# Patient Record
Sex: Male | Born: 1950
Health system: Southern US, Community
[De-identification: ages and names within clinical notes are randomized; demographics above are authoritative.]

## PROBLEM LIST (undated history)

## (undated) DIAGNOSIS — IMO0002 Reserved for concepts with insufficient information to code with codable children: Secondary | ICD-10-CM

## (undated) DIAGNOSIS — I2510 Atherosclerotic heart disease of native coronary artery without angina pectoris: Secondary | ICD-10-CM

## (undated) DIAGNOSIS — G4730 Sleep apnea, unspecified: Secondary | ICD-10-CM

## (undated) DIAGNOSIS — D3A020 Benign carcinoid tumor of the appendix: Secondary | ICD-10-CM

## (undated) DIAGNOSIS — I219 Acute myocardial infarction, unspecified: Secondary | ICD-10-CM

## (undated) DIAGNOSIS — E785 Hyperlipidemia, unspecified: Secondary | ICD-10-CM

## (undated) DIAGNOSIS — R0789 Other chest pain: Secondary | ICD-10-CM

## (undated) DIAGNOSIS — K2270 Barrett's esophagus without dysplasia: Secondary | ICD-10-CM

## (undated) HISTORY — DX: Benign carcinoid tumor of the appendix: D3A.020

## (undated) HISTORY — DX: Hyperlipidemia, unspecified: E78.5

## (undated) HISTORY — DX: Other chest pain: R07.89

## (undated) HISTORY — PX: CARDIAC CATHETERIZATION: SHX172

## (undated) HISTORY — PX: COLONOSCOPY: SHX174

## (undated) HISTORY — PX: UPPER GASTROINTESTINAL ENDOSCOPY: SHX188

## (undated) HISTORY — DX: Barrett's esophagus without dysplasia: K22.70

## (undated) HISTORY — PX: TONSILLECTOMY: SUR1361

## (undated) HISTORY — PX: HERNIA REPAIR: SHX51

---

## 1994-01-15 HISTORY — PX: OTHER SURGICAL HISTORY: SHX169

## 1994-01-15 HISTORY — PX: SPLENECTOMY: SUR1306

## 1994-01-15 HISTORY — PX: APPENDECTOMY: SHX54

## 1996-01-16 HISTORY — PX: CHOLECYSTECTOMY: SHX55

## 2000-07-15 ENCOUNTER — Ambulatory Visit (HOSPITAL_COMMUNITY): Admission: RE | Admit: 2000-07-15 | Discharge: 2000-07-15 | Payer: Self-pay | Admitting: Internal Medicine

## 2001-07-01 ENCOUNTER — Ambulatory Visit (HOSPITAL_COMMUNITY): Admission: RE | Admit: 2001-07-01 | Discharge: 2001-07-01 | Payer: Self-pay | Admitting: Internal Medicine

## 2001-07-01 ENCOUNTER — Encounter: Payer: Self-pay | Admitting: Internal Medicine

## 2001-07-07 ENCOUNTER — Encounter: Payer: Self-pay | Admitting: Internal Medicine

## 2001-07-07 ENCOUNTER — Ambulatory Visit (HOSPITAL_COMMUNITY): Admission: RE | Admit: 2001-07-07 | Discharge: 2001-07-07 | Payer: Self-pay | Admitting: Internal Medicine

## 2001-09-14 ENCOUNTER — Inpatient Hospital Stay (HOSPITAL_COMMUNITY): Admission: EM | Admit: 2001-09-14 | Discharge: 2001-09-17 | Payer: Self-pay | Admitting: Emergency Medicine

## 2001-09-14 ENCOUNTER — Encounter: Payer: Self-pay | Admitting: Emergency Medicine

## 2001-09-16 ENCOUNTER — Encounter: Payer: Self-pay | Admitting: *Deleted

## 2003-04-20 ENCOUNTER — Ambulatory Visit (HOSPITAL_COMMUNITY): Admission: RE | Admit: 2003-04-20 | Discharge: 2003-04-20 | Payer: Self-pay | Admitting: Internal Medicine

## 2006-01-30 ENCOUNTER — Encounter (HOSPITAL_COMMUNITY): Admission: RE | Admit: 2006-01-30 | Discharge: 2006-03-01 | Payer: Self-pay | Admitting: Internal Medicine

## 2006-05-06 ENCOUNTER — Encounter (HOSPITAL_COMMUNITY): Admission: RE | Admit: 2006-05-06 | Discharge: 2006-06-05 | Payer: Self-pay | Admitting: Oncology

## 2006-05-06 ENCOUNTER — Ambulatory Visit (HOSPITAL_COMMUNITY): Payer: Self-pay | Admitting: Oncology

## 2006-05-07 ENCOUNTER — Encounter (HOSPITAL_COMMUNITY): Payer: Self-pay | Admitting: Oncology

## 2006-05-07 ENCOUNTER — Other Ambulatory Visit: Admission: RE | Admit: 2006-05-07 | Discharge: 2006-05-07 | Payer: Self-pay | Admitting: Oncology

## 2007-06-23 ENCOUNTER — Inpatient Hospital Stay (HOSPITAL_COMMUNITY): Admission: AD | Admit: 2007-06-23 | Discharge: 2007-06-25 | Payer: Self-pay | Admitting: Cardiology

## 2007-06-23 ENCOUNTER — Ambulatory Visit: Payer: Self-pay | Admitting: Cardiology

## 2007-06-23 ENCOUNTER — Encounter: Payer: Self-pay | Admitting: Emergency Medicine

## 2007-06-27 ENCOUNTER — Ambulatory Visit: Payer: Self-pay | Admitting: Cardiology

## 2007-06-27 ENCOUNTER — Observation Stay (HOSPITAL_COMMUNITY): Admission: AD | Admit: 2007-06-27 | Discharge: 2007-06-28 | Payer: Self-pay | Admitting: Cardiology

## 2007-06-27 ENCOUNTER — Encounter: Payer: Self-pay | Admitting: Emergency Medicine

## 2007-07-28 ENCOUNTER — Ambulatory Visit: Payer: Self-pay | Admitting: Cardiology

## 2007-11-10 ENCOUNTER — Ambulatory Visit: Payer: Self-pay | Admitting: Cardiology

## 2007-12-01 ENCOUNTER — Ambulatory Visit: Payer: Self-pay | Admitting: Cardiology

## 2007-12-04 ENCOUNTER — Encounter (HOSPITAL_COMMUNITY): Admission: RE | Admit: 2007-12-04 | Discharge: 2008-01-03 | Payer: Self-pay | Admitting: Cardiology

## 2007-12-04 ENCOUNTER — Ambulatory Visit: Payer: Self-pay | Admitting: Cardiology

## 2007-12-08 ENCOUNTER — Ambulatory Visit: Payer: Self-pay | Admitting: Cardiology

## 2008-02-11 ENCOUNTER — Ambulatory Visit: Payer: Self-pay | Admitting: Cardiology

## 2008-04-26 ENCOUNTER — Encounter (INDEPENDENT_AMBULATORY_CARE_PROVIDER_SITE_OTHER): Payer: Self-pay | Admitting: *Deleted

## 2008-07-05 DIAGNOSIS — E785 Hyperlipidemia, unspecified: Secondary | ICD-10-CM | POA: Insufficient documentation

## 2008-11-04 ENCOUNTER — Encounter (INDEPENDENT_AMBULATORY_CARE_PROVIDER_SITE_OTHER): Payer: Self-pay | Admitting: *Deleted

## 2008-11-04 ENCOUNTER — Telehealth (INDEPENDENT_AMBULATORY_CARE_PROVIDER_SITE_OTHER): Payer: Self-pay | Admitting: *Deleted

## 2009-01-18 ENCOUNTER — Encounter: Payer: Self-pay | Admitting: Cardiology

## 2009-01-20 ENCOUNTER — Encounter: Payer: Self-pay | Admitting: Cardiology

## 2009-01-20 LAB — CONVERTED CEMR LAB
AST: 22 units/L (ref 0–37)
Alkaline Phosphatase: 61 units/L (ref 39–117)
BUN: 15 mg/dL (ref 6–23)
Basophils Absolute: 0.1 10*3/uL (ref 0.0–0.1)
Basophils Relative: 1 % (ref 0–1)
Calcium: 9.1 mg/dL (ref 8.4–10.5)
Cholesterol: 130 mg/dL (ref 0–200)
Creatinine, Ser: 1.14 mg/dL (ref 0.40–1.50)
HCT: 49.2 % (ref 39.0–52.0)
HDL: 33 mg/dL — ABNORMAL LOW (ref >39)
LDL Cholesterol: 66 mg/dL (ref 0–99)
Lymphocytes Relative: 45 % (ref 12–46)
MCHC: 32.5 g/dL (ref 30.0–36.0)
MCV: 95.5 fL (ref 78.0–100.0)
Platelets: 482 10*3/uL — ABNORMAL HIGH (ref 150–400)
Total CHOL/HDL Ratio: 3.9
Triglycerides: 153 mg/dL — ABNORMAL HIGH (ref <150)
VLDL: 31 mg/dL (ref 0–40)
WBC: 10.2 10*3/uL (ref 4.0–10.5)

## 2009-01-24 ENCOUNTER — Ambulatory Visit: Payer: Self-pay | Admitting: Cardiology

## 2009-01-24 DIAGNOSIS — I2510 Atherosclerotic heart disease of native coronary artery without angina pectoris: Secondary | ICD-10-CM

## 2009-07-31 ENCOUNTER — Encounter (INDEPENDENT_AMBULATORY_CARE_PROVIDER_SITE_OTHER): Payer: Self-pay

## 2009-07-31 ENCOUNTER — Encounter: Payer: Self-pay | Admitting: Cardiology

## 2009-07-31 ENCOUNTER — Ambulatory Visit: Payer: Self-pay | Admitting: Cardiology

## 2009-07-31 ENCOUNTER — Inpatient Hospital Stay (HOSPITAL_COMMUNITY): Admission: EM | Admit: 2009-07-31 | Discharge: 2009-08-04 | Payer: Self-pay | Admitting: Emergency Medicine

## 2009-07-31 ENCOUNTER — Ambulatory Visit: Payer: Self-pay | Admitting: Gastroenterology

## 2009-08-01 ENCOUNTER — Ambulatory Visit: Payer: Self-pay | Admitting: Internal Medicine

## 2009-08-04 ENCOUNTER — Telehealth: Payer: Self-pay | Admitting: Gastroenterology

## 2009-08-04 ENCOUNTER — Ambulatory Visit: Payer: Self-pay | Admitting: Gastroenterology

## 2009-09-01 ENCOUNTER — Encounter: Payer: Self-pay | Admitting: Gastroenterology

## 2009-12-19 ENCOUNTER — Ambulatory Visit: Payer: Self-pay | Admitting: Internal Medicine

## 2010-02-14 NOTE — Consult Note (Signed)
Summary: [facility]   Imported By: Roderic Ovens 08/16/2009 16:00:32  _____________________________________________________________________  External Attachment:    Type:   Image     Comment:   External Document

## 2010-02-14 NOTE — Progress Notes (Signed)
Summary: NUR PT CDIFF [id number] DAYS       New/Updated Medications: VANCOCIN HCL 125 MG  CAPS (VANCOMYCIN HCL) 1 by mouth q6h for 14 days Prescriptions: VANCOCIN HCL 125 MG  CAPS (VANCOMYCIN HCL) 1 by mouth q6h for 14 days  #56 x 0   Entered and Authorized by:   West Bali MD   Signed by:   West Bali MD on 08/04/2009   Method used:   Electronically to        [facility]  [address] (retail)       [address]       [place], [place]  [address]       Ph: [phone]       Fax: [phone]   RxID:   [id number]   Pt being discharged today. Rx Vanc sent. West Bali MD  August 04, 2009 12:11 PM

## 2010-02-14 NOTE — Assessment & Plan Note (Signed)
Summary: 10 MN F/U PER CKOUT 02/11/08-DSF  Medications Added PLAVIX 75 MG TABS (CLOPIDOGREL BISULFATE) take 1 tab daily CRESTOR 40 MG TABS (ROSUVASTATIN CALCIUM) take 1 tab daily ASPIRIN 81 MG TBEC (ASPIRIN) take 1 tab daily PRILOSEC 20 MG CPDR (OMEPRAZOLE) Take 1 tablet by mouth once a day      Allergies Added: NKDA  Visit Type:  Follow-up Primary Provider:  Dr.Fusco   History of Present Illness: [name] returns for evaluation and management of his chest discomfort, mixed hyperlipidemia, and history of coronary artery disease.  Acid suppressants helped his chest discomfort. He has had no angina or ischemic equivalence. He remains active on the farm.  He denies any orthopnea, PND, palpitations, or peripheral edema. He said no significant shortness of breath or dyspnea on exertion.  Current Medications (verified): 1)  Plavix 75 Mg Tabs (Clopidogrel Bisulfate) .... Take 1 Tab Daily 2)  Crestor 40 Mg Tabs (Rosuvastatin Calcium) .... Take 1 Tab Daily 3)  Aspirin 81 Mg Tbec (Aspirin) .... Take 1 Tab Daily 4)  Prilosec 20 Mg Cpdr (Omeprazole) .... Take 1 Tablet By Mouth Once A Day  Allergies (verified): No Known Drug Allergies  Past History:  Past Medical History: Last updated: 07/05/2008 HYPERLIPIDEMIA-MIXED (ICD-272.4) CHEST TIGHTNESS-PRESSURE-OTHER [phone])    Family History: Last updated: 07/05/2008 Family History of Coronary Artery Disease:   Social History: Last updated: 07/05/2008 Part Time  - Pharmacist Married  Alcohol Use - no Regular Exercise - yes Drug Use - no Tobacco Use - No.   Risk Factors: Exercise: yes (07/05/2008)  Risk Factors: Smoking Status: never (07/05/2008)  Review of Systems       negative other than history of present illness  Vital Signs:  Patient profile:   60 year old male Height:      73 inches Weight:      225 pounds BMI:     29.79 Pulse rate:   81 / minute BP sitting:   118 / 80  (right arm)  Vitals Entered By:  Dreama Saa, CNA (January 24, 2009 9:59 AM)  Physical Exam  General:  pleasant, mildly overweight, in no acute distress Head:  normocephalic and atraumatic Eyes:  PERRLA/EOM intact; conjunctiva and lids normal. Mouth:  Teeth, gums and palate normal. Oral mucosa normal. Neck:  Neck supple, no JVD. No masses, thyromegaly or abnormal cervical nodes. Lungs:  Clear bilaterally to auscultation and percussion. Heart:  Non-displaced PMI, chest non-tender; regular rate and rhythm, S1, S2 without murmurs, rubs or gallops. Carotid upstroke normal, no bruit. Normal abdominal aortic size, no bruits. Femorals normal pulses, no bruits. Pedals normal pulses. No edema, no varicosities. Abdomen:  Bowel sounds positive; abdomen soft and non-tender without masses, organomegaly, or hernias noted. No hepatosplenomegaly. Msk:  Back normal, normal gait. Muscle strength and tone normal. Pulses:  pulses normal in all 4 extremities Extremities:  No clubbing or cyanosis. Neurologic:  Alert and oriented x 3. Skin:  Intact without lesions or rashes. Psych:  Normal affect.   Problems:  Medical Problems Added: 1)  Dx of Chest Pain-unspecified  (ICD-786.50) 2)  Dx of Chest Pain-unspecified  (ICD-786.50) 3)  Dx of Cad, Native Vessel  (ICD-414.01)  EKG  Procedure date:  01/24/2009  Findings:      normal sinus rhythm, low voltage, poor R-wave progression in the anterior according, stable  Impression & Recommendations:  Problem # 1:  CAD, NATIVE VESSEL (ICD-414.01) Assessment Unchanged  His updated medication list for this problem includes:    Plavix 75  Mg Tabs (Clopidogrel bisulfate) .Marland Kitchen... Take 1 tab daily    Aspirin 81 Mg Tbec (Aspirin) .Marland Kitchen... Take 1 tab daily  His updated medication list for this problem includes:    Plavix 75 Mg Tabs (Clopidogrel bisulfate) .Marland Kitchen... Take 1 tab daily    Aspirin 81 Mg Tbec (Aspirin) .Marland Kitchen... Take 1 tab daily  Problem # 2:  CHEST PAIN-UNSPECIFIED (ICD-786.50) Assessment:  Improved In Retrospect this was most likely reflux. Dr.Rehman has recommended Prilosec which have cleared with him. His updated medication list for this problem includes:    Plavix 75 Mg Tabs (Clopidogrel bisulfate) .Marland Kitchen... Take 1 tab daily    Aspirin 81 Mg Tbec (Aspirin) .Marland Kitchen... Take 1 tab daily  Problem # 3:  HYPERLIPIDEMIA-MIXED (ICD-272.4) Assessment: Improved I have reviewed his lipid panel and other blood work with him in the office today. His LDL is at goal we'll make no changes in his Crestor. Check labs again in one year His updated medication list for this problem includes:    Crestor 40 Mg Tabs (Rosuvastatin calcium) .Marland Kitchen... Take 1 tab daily  Patient Instructions: 1)  Your physician recommends that you schedule a follow-up appointment in: 1year Prescriptions: CRESTOR 40 MG TABS (ROSUVASTATIN CALCIUM) take 1 tab daily  #90 x 3   Entered by:   Teressa Lower RN   Authorized by:   Gaylord Shih, MD, [facility]   Signed by:   Teressa Lower RN on 01/24/2009   Method used:   Electronically to        [facility]* (mail-order)             ,          Ph: [phone]       Fax: [phone]   RxID:   [id number] PLAVIX 75 MG TABS (CLOPIDOGREL BISULFATE) take 1 tab daily  #90 x 3   Entered by:   Teressa Lower RN   Authorized by:   Gaylord Shih, MD, [facility]   Signed by:   Teressa Lower RN on 01/24/2009   Method used:   Electronically to        [facility]* (mail-order)             ,          Ph: [phone]       Fax: [phone]   RxID:   [id number]

## 2010-02-14 NOTE — Letter (Signed)
Summary: CONSULTATION  CONSULTATION   Imported By: Rexene Alberts 09/01/2009 11:28:44  _____________________________________________________________________  External Attachment:    Type:   Image     Comment:   External Document

## 2010-02-14 NOTE — Letter (Signed)
Summary: [facility]  [facility]   Imported By: Marylou Mccoy 04/28/2009 16:51:56  _____________________________________________________________________  External Attachment:    Type:   Image     Comment:   External Document

## 2010-03-07 ENCOUNTER — Ambulatory Visit (INDEPENDENT_AMBULATORY_CARE_PROVIDER_SITE_OTHER): Payer: BC Managed Care – PPO | Admitting: Internal Medicine

## 2010-03-07 DIAGNOSIS — Z862 Personal history of diseases of the blood and blood-forming organs and certain disorders involving the immune mechanism: Secondary | ICD-10-CM

## 2010-03-07 DIAGNOSIS — D72820 Lymphocytosis (symptomatic): Secondary | ICD-10-CM

## 2010-03-07 DIAGNOSIS — R5381 Other malaise: Secondary | ICD-10-CM

## 2010-03-17 ENCOUNTER — Ambulatory Visit (HOSPITAL_COMMUNITY): Payer: BC Managed Care – PPO | Admitting: Oncology

## 2010-03-17 DIAGNOSIS — D591 Other autoimmune hemolytic anemias: Secondary | ICD-10-CM

## 2010-03-20 ENCOUNTER — Other Ambulatory Visit (HOSPITAL_COMMUNITY): Payer: Self-pay | Admitting: Oncology

## 2010-03-20 ENCOUNTER — Encounter (HOSPITAL_COMMUNITY): Payer: BC Managed Care – PPO | Attending: Oncology

## 2010-03-20 ENCOUNTER — Other Ambulatory Visit (HOSPITAL_COMMUNITY)
Admission: RE | Admit: 2010-03-20 | Discharge: 2010-03-20 | Disposition: A | Discharge status: Home / Self Care | Payer: BC Managed Care – PPO | Source: Ambulatory Visit | Attending: Oncology | Admitting: Oncology

## 2010-03-20 ENCOUNTER — Encounter: Payer: Self-pay | Admitting: *Deleted

## 2010-03-20 ENCOUNTER — Other Ambulatory Visit (HOSPITAL_COMMUNITY): Payer: BC Managed Care – PPO

## 2010-03-20 DIAGNOSIS — D509 Iron deficiency anemia, unspecified: Secondary | ICD-10-CM | POA: Insufficient documentation

## 2010-03-20 DIAGNOSIS — Z79899 Other long term (current) drug therapy: Secondary | ICD-10-CM | POA: Insufficient documentation

## 2010-03-20 DIAGNOSIS — D473 Essential (hemorrhagic) thrombocythemia: Secondary | ICD-10-CM | POA: Insufficient documentation

## 2010-03-20 DIAGNOSIS — D72820 Lymphocytosis (symptomatic): Secondary | ICD-10-CM | POA: Insufficient documentation

## 2010-03-20 DIAGNOSIS — Z8711 Personal history of peptic ulcer disease: Secondary | ICD-10-CM | POA: Insufficient documentation

## 2010-03-20 DIAGNOSIS — R6889 Other general symptoms and signs: Secondary | ICD-10-CM

## 2010-03-20 DIAGNOSIS — Z9089 Acquired absence of other organs: Secondary | ICD-10-CM | POA: Insufficient documentation

## 2010-03-20 DIAGNOSIS — Z7982 Long term (current) use of aspirin: Secondary | ICD-10-CM | POA: Insufficient documentation

## 2010-03-20 DIAGNOSIS — R799 Abnormal finding of blood chemistry, unspecified: Secondary | ICD-10-CM | POA: Insufficient documentation

## 2010-03-20 DIAGNOSIS — D72829 Elevated white blood cell count, unspecified: Secondary | ICD-10-CM | POA: Insufficient documentation

## 2010-03-20 DIAGNOSIS — E7800 Pure hypercholesterolemia, unspecified: Secondary | ICD-10-CM | POA: Insufficient documentation

## 2010-03-20 LAB — COMPREHENSIVE METABOLIC PANEL
Albumin: 3.9 g/dL (ref 3.5–5.2)
Alkaline Phosphatase: 64 U/L (ref 39–117)
BUN: 18 mg/dL (ref 6–23)
CO2: 26 mEq/L (ref 19–32)
Calcium: 10 mg/dL (ref 8.4–10.5)
Chloride: 104 mEq/L (ref 96–112)
Creatinine, Ser: 1.11 mg/dL (ref 0.4–1.5)
GFR calc Af Amer: >60 mL/min (ref >60)
Potassium: 4.8 mEq/L (ref 3.5–5.1)
Sodium: 138 mEq/L (ref 135–145)
Total Bilirubin: 0.9 mg/dL (ref 0.3–1.2)
Total Protein: 6.8 g/dL (ref 6.0–8.3)

## 2010-03-20 LAB — LACTATE DEHYDROGENASE: LDH: 146 U/L (ref 94–250)

## 2010-03-22 LAB — PROTEIN ELECTROPHORESIS, SERUM
Albumin ELP: 56.7 % (ref 55.8–66.1)
Alpha-2-Globulin: 11 % (ref 7.1–11.8)
M-Spike, %: NOT DETECTED g/dL

## 2010-03-31 ENCOUNTER — Other Ambulatory Visit (HOSPITAL_COMMUNITY): Payer: Self-pay | Admitting: Oncology

## 2010-03-31 ENCOUNTER — Ambulatory Visit (HOSPITAL_COMMUNITY): Payer: BC Managed Care – PPO | Admitting: Oncology

## 2010-03-31 DIAGNOSIS — D509 Iron deficiency anemia, unspecified: Secondary | ICD-10-CM

## 2010-03-31 DIAGNOSIS — D72829 Elevated white blood cell count, unspecified: Secondary | ICD-10-CM

## 2010-03-31 LAB — HEMOCCULT GUIAC POC 1CARD (OFFICE): Fecal Occult Bld: NEGATIVE

## 2010-04-01 LAB — COMPREHENSIVE METABOLIC PANEL
ALT: 51 U/L (ref 0–53)
AST: 43 U/L — ABNORMAL HIGH (ref 0–37)
Albumin: 2.8 g/dL — ABNORMAL LOW (ref 3.5–5.2)
Albumin: 2.9 g/dL — ABNORMAL LOW (ref 3.5–5.2)
Alkaline Phosphatase: 59 U/L (ref 39–117)
Alkaline Phosphatase: 89 U/L (ref 39–117)
BUN: 20 mg/dL (ref 6–23)
CO2: 23 mEq/L (ref 19–32)
Calcium: 7.8 mg/dL — ABNORMAL LOW (ref 8.4–10.5)
Calcium: 8.2 mg/dL — ABNORMAL LOW (ref 8.4–10.5)
Chloride: 108 mEq/L (ref 96–112)
Creatinine, Ser: 0.94 mg/dL (ref 0.4–1.5)
Creatinine, Ser: 1.05 mg/dL (ref 0.4–1.5)
GFR calc Af Amer: >60 mL/min (ref >60)
GFR calc non Af Amer: >60 mL/min (ref >60)
Glucose, Bld: 109 mg/dL — ABNORMAL HIGH (ref 70–99)
Total Protein: 5.1 g/dL — ABNORMAL LOW (ref 6.0–8.3)
Total Protein: 5.4 g/dL — ABNORMAL LOW (ref 6.0–8.3)

## 2010-04-01 LAB — CLOSTRIDIUM DIFFICILE EIA

## 2010-04-01 LAB — CBC
HCT: 27.5 % — ABNORMAL LOW (ref 39.0–52.0)
HCT: 28 % — ABNORMAL LOW (ref 39.0–52.0)
HCT: 31 % — ABNORMAL LOW (ref 39.0–52.0)
HCT: 32 % — ABNORMAL LOW (ref 39.0–52.0)
Hemoglobin: 10.1 g/dL — ABNORMAL LOW (ref 13.0–17.0)
MCH: 31.8 pg (ref 26.0–34.0)
MCH: 31.9 pg (ref 26.0–34.0)
MCH: 31.9 pg (ref 26.0–34.0)
MCH: 31.9 pg (ref 26.0–34.0)
MCH: 32.3 pg (ref 26.0–34.0)
MCHC: 34 g/dL (ref 30.0–36.0)
MCHC: 34.2 g/dL (ref 30.0–36.0)
MCV: 93.7 fL (ref 78.0–100.0)
MCV: 93.8 fL (ref 78.0–100.0)
MCV: 93.8 fL (ref 78.0–100.0)
MCV: 94.3 fL (ref 78.0–100.0)
MCV: 94.6 fL (ref 78.0–100.0)
Platelets: 258 10*3/uL (ref 150–400)
Platelets: 358 10*3/uL (ref 150–400)
Platelets: 361 10*3/uL (ref 150–400)
Platelets: 549 10*3/uL — ABNORMAL HIGH (ref 150–400)
Platelets: 627 10*3/uL — ABNORMAL HIGH (ref 150–400)
RBC: 2.96 MIL/uL — ABNORMAL LOW (ref 4.22–5.81)
RBC: 3.18 MIL/uL — ABNORMAL LOW (ref 4.22–5.81)
RBC: 3.31 MIL/uL — ABNORMAL LOW (ref 4.22–5.81)
RBC: 3.32 MIL/uL — ABNORMAL LOW (ref 4.22–5.81)
RBC: 3.43 MIL/uL — ABNORMAL LOW (ref 4.22–5.81)
RDW: 12.9 % (ref 11.5–15.5)
RDW: 12.9 % (ref 11.5–15.5)
WBC: 22.1 10*3/uL — ABNORMAL HIGH (ref 4.0–10.5)
WBC: 22.7 10*3/uL — ABNORMAL HIGH (ref 4.0–10.5)
WBC: 25.4 10*3/uL — ABNORMAL HIGH (ref 4.0–10.5)
WBC: 27.2 10*3/uL — ABNORMAL HIGH (ref 4.0–10.5)

## 2010-04-01 LAB — DIFFERENTIAL
Basophils Absolute: 0 10*3/uL (ref 0.0–0.1)
Basophils Relative: 0 % (ref 0–1)
Basophils Relative: 0 % (ref 0–1)
Basophils Relative: 1 % (ref 0–1)
Eosinophils Absolute: 0.1 10*3/uL (ref 0.0–0.7)
Eosinophils Absolute: 0.3 10*3/uL (ref 0.0–0.7)
Eosinophils Absolute: 0.3 10*3/uL (ref 0.0–0.7)
Eosinophils Absolute: 0.3 10*3/uL (ref 0.0–0.7)
Eosinophils Absolute: 0.3 10*3/uL (ref 0.0–0.7)
Eosinophils Relative: 1 % (ref 0–5)
Eosinophils Relative: 1 % (ref 0–5)
Eosinophils Relative: 2 % (ref 0–5)
Lymphocytes Relative: 44 % (ref 12–46)
Lymphocytes Relative: 46 % (ref 12–46)
Lymphocytes Relative: 48 % — ABNORMAL HIGH (ref 12–46)
Lymphocytes Relative: 53 % — ABNORMAL HIGH (ref 12–46)
Lymphocytes Relative: 56 % — ABNORMAL HIGH (ref 12–46)
Lymphs Abs: 12.2 10*3/uL — ABNORMAL HIGH (ref 0.7–4.0)
Lymphs Abs: 12.7 10*3/uL — ABNORMAL HIGH (ref 0.7–4.0)
Lymphs Abs: 14.4 10*3/uL — ABNORMAL HIGH (ref 0.7–4.0)
Lymphs Abs: 8.6 10*3/uL — ABNORMAL HIGH (ref 0.7–4.0)
Lymphs Abs: 9.6 10*3/uL — ABNORMAL HIGH (ref 0.7–4.0)
Lymphs Abs: 9.6 10*3/uL — ABNORMAL HIGH (ref 0.7–4.0)
Monocytes Absolute: 1.9 10*3/uL — ABNORMAL HIGH (ref 0.1–1.0)
Monocytes Absolute: 2.8 10*3/uL — ABNORMAL HIGH (ref 0.1–1.0)
Monocytes Absolute: 3.1 10*3/uL — ABNORMAL HIGH (ref 0.1–1.0)
Monocytes Relative: 10 % (ref 3–12)
Monocytes Relative: 11 % (ref 3–12)
Monocytes Relative: 12 % (ref 3–12)
Monocytes Relative: 13 % — ABNORMAL HIGH (ref 3–12)
Neutro Abs: 6.8 10*3/uL (ref 1.7–7.7)
Neutro Abs: 8.8 10*3/uL — ABNORMAL HIGH (ref 1.7–7.7)
Neutrophils Relative %: 30 % — ABNORMAL LOW (ref 43–77)
Neutrophils Relative %: 40 % — ABNORMAL LOW (ref 43–77)
Neutrophils Relative %: 42 % — ABNORMAL LOW (ref 43–77)
Neutrophils Relative %: 43 % (ref 43–77)

## 2010-04-01 LAB — POCT CARDIAC MARKERS
CKMB, poc: 1.4 ng/mL (ref 1.0–8.0)
Myoglobin, poc: 109 ng/mL (ref 12–200)
Troponin i, poc: <0.05 ng/mL (ref 0.00–0.09)

## 2010-04-01 LAB — CARDIAC PANEL(CRET KIN+CKTOT+MB+TROPI)
Relative Index: INVALID (ref 0.0–2.5)
Total CK: 119 U/L (ref 7–232)
Troponin I: 0.01 ng/mL (ref 0.00–0.06)

## 2010-04-01 LAB — URINALYSIS, ROUTINE W REFLEX MICROSCOPIC
Bilirubin Urine: NEGATIVE
Ketones, ur: NEGATIVE mg/dL
Nitrite: NEGATIVE
Specific Gravity, Urine: <1.005 — ABNORMAL LOW (ref 1.005–1.030)
Urobilinogen, UA: 0.2 mg/dL (ref 0.0–1.0)
pH: 6 (ref 5.0–8.0)

## 2010-04-01 LAB — STOOL CULTURE

## 2010-04-01 LAB — ABO/RH: ABO/RH(D): O POS

## 2010-04-01 LAB — HEMOGLOBIN AND HEMATOCRIT, BLOOD
HCT: 27.1 % — ABNORMAL LOW (ref 39.0–52.0)
Hemoglobin: 9.2 g/dL — ABNORMAL LOW (ref 13.0–17.0)

## 2010-04-01 LAB — LIPASE, BLOOD: Lipase: 32 U/L (ref 11–59)

## 2010-04-01 LAB — OVA AND PARASITE EXAMINATION

## 2010-04-01 LAB — LIPID PANEL
HDL: 22 mg/dL — ABNORMAL LOW (ref >39)
Total CHOL/HDL Ratio: 5 RATIO
Triglycerides: 226 mg/dL — ABNORMAL HIGH (ref <150)
VLDL: 45 mg/dL — ABNORMAL HIGH (ref 0–40)

## 2010-04-01 LAB — TYPE AND SCREEN

## 2010-04-01 LAB — BASIC METABOLIC PANEL
Calcium: 8.3 mg/dL — ABNORMAL LOW (ref 8.4–10.5)
Creatinine, Ser: 1.04 mg/dL (ref 0.4–1.5)
GFR calc Af Amer: >60 mL/min (ref >60)
GFR calc non Af Amer: >60 mL/min (ref >60)

## 2010-04-11 ENCOUNTER — Ambulatory Visit: Payer: Self-pay | Admitting: Cardiology

## 2010-05-02 ENCOUNTER — Ambulatory Visit (HOSPITAL_COMMUNITY)
Admission: RE | Admit: 2010-05-02 | Payer: BC Managed Care – PPO | Source: Ambulatory Visit | Admitting: Internal Medicine

## 2010-05-03 ENCOUNTER — Encounter (INDEPENDENT_AMBULATORY_CARE_PROVIDER_SITE_OTHER): Payer: BC Managed Care – PPO | Admitting: Internal Medicine

## 2010-05-30 NOTE — Cardiovascular Report (Signed)
NAME:  Samuel Tanner, CERASOLI NO.:  [id number]   MEDICAL RECORD NO.:  [id number]          PATIENT TYPE:  INP   LOCATION:  3705                         FACILITY:  [facility]   PHYSICIAN:  Arturo Morton. Riley Kill, MD, FACCDATE OF BIRTH:  26-Dec-1950   DATE OF PROCEDURE:  DATE OF DISCHARGE:                            CARDIAC CATHETERIZATION   INDICATIONS:  Mr. Haffey is a 60 year old gentleman who underwent  percutaneous stenting for a high-grade mid circumflex stenosis.  A 3.5-  mm stent was placed, and aggressively post dilated.  He has developed  some recurrent symptoms worrisome for recurrence, although without  definite electrocardiographic changes, and with no enzyme elevation.  He  was brought back to the laboratory for repeat evaluation.   Risks, benefits, and alternatives were discussed with the patient and he  consented to proceed.   PROCEDURE:  1. Left heart catheterization.  2. Selective coronary arteriography.  3. Selective left ventriculography.  4. Intravascular ultrasound of the circumflex coronary artery.   DESCRIPTION OF PROCEDURE:  The patient was brought to the cath lab and  prepped and draped in usual fashion.  Because the right femoral artery  had been previously used we elected to left femoral artery.  A 5-French  sheath was placed without difficulty.  Views of left and right coronary  arteries were then obtained in multiple angiographic projections.  Central aortic and left ventricular pressures were measured with a  pigtail.  Ventriculography was then performed in the RAO projection.  I  then carefully compared the previous studies to the current studies.  There was a slight gap between the stent wall and the lumen  angiographically, and that we were concerned about the possibility of  layering thrombus given the angiographic findings.  In concert with the  patient's symptoms, I felt that intravascular ultrasound should be  performed.  I discussed this with  the patient.  We then used a JL-4  guiding catheter, bivalved routing according to protocol.  The 5-French  sheath was stepped up to a 6-French sheath.  We used a Research scientist (physical sciences),  and then [employer] intravascular ultrasound catheter was then  placed down in the artery where run was performed.  This demonstrated  what appeared to be a widely patent stent with good apposition, although  in the midportion of the stent there was a small filling defect which  likely could represent some mild eccentric plaque prolapse and/or the  possibility of thrombus could not be excluded.  There appeared to be  reasonably good blood speckle throughout.  All catheters were then  subsequently removed, and final angiographic views obtained.  I then  spoke with the patient's wife.  Femoral sheath was sewn into place and  was taken to the holding area in satisfactory clinical condition.   HEMODYNAMIC DATA:  1. Central aortic pressure was 118/78.  2. Left ventricular pressure 104/9.  3. There was no gradient on pullback across the aortic valve.   ANGIOGRAPHIC DATA:  1. Ventriculography done in the RAO projection reveals vigorous global      systolic function.  No segmental abnormalities  are identified.      There was a small inferior diverticulum, and this had been noted on      the previous angiographic study.  2. The left main is free of critical disease.  3. The LAD has a clear-cut fairly focal mid stenosis of about 40%.  It      does not appear to be high-grade or flow-limiting.  The vessel then      goes distally to the apex where there was mild luminal irregularity      after the diagonal.  The diagonal itself is a small bifurcating      vessel without critical narrowing.  4. The circumflex provides a large marginal branch that has been      stented.  There is a perhaps about 20% narrowing in the midportion      of the stent which corresponds to the area seen on intravascular      ultrasound.   Distally the vessel trifurcates.  It does not appear      to be flow-limiting.  5. The right coronary artery has mild luminal irregularity but no      significant focal stenosis.  6. Intravascular ultrasound was performed.  There was good apposition      throughout.  The overall minimum lumen diameter appeared to be      appropriate for the stent being between 3.25 and 3.4 throughout      most of the stent.  In the midportion of the stent, there is a very      small filling defect that could represent tissue prolapse.  Its      density is that of some plaque, although a small amount of layering      thrombus cannot be excluded.   CONCLUSION:  1. Preserved left ventricular function.  2. Mild disease of the left anterior descending artery.  3. Normal-appearing right coronary artery.  4. Continued patency of the previously placed stent with a small area      of filling defect possibly representing small area of plaque,      tissue prolapse, and/or thrombus.   DISPOSITION:  The patient will be treated medically.  We will increase  his Plavix to b.i.d. for about 1 week, then resume his normal amount.  We will need to monitor his symptoms prospectively.      Arturo Morton. Riley Kill, MD, [facility]  Electronically Signed     TDS/MEDQ  D:  06/27/2007  T:  06/28/2007  Job:  [id number]   cc:   Madelin Rear. Sherwood Gambler, MD  Jonelle Sidle, MD  Jesse Sans Wall, MD, [facility]

## 2010-05-30 NOTE — Discharge Summary (Signed)
NAME:  Samuel Tanner, REPINSKI NO.:  [id number]   MEDICAL RECORD NO.:  [id number]          PATIENT TYPE:  INP   LOCATION:  6526                         FACILITY:  [facility]   PHYSICIAN:  Jesse Sans. Wall, MD, FACCDATE OF BIRTH:  10-10-1950   DATE OF ADMISSION:  06/23/2007  DATE OF DISCHARGE:  06/25/2007                         DISCHARGE SUMMARY - REFERRING   DISCHARGE DIAGNOSES:  1. Non-ST elevated myocardial infarction.  2. History of hyperlipidemia.  3. History as listed below.   PROCEDURES PERFORMED:  Cardiac catheterization on June 24, 2007, by Dr.  Diona Browner with drug-eluting stent placed to the proximal obtuse marginal  by Dr. Excell Seltzer.   SUMMARY OF HISTORY:  Mr. Floyd is a 60 year old white male who was  transferred from [facility] to [facility] for further  evaluation of chest discomfort.  He describes a several week history of  progressive exertional chest discomfort associated with some shortness  of breath and weakness.   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   LABORATORY DATA:  Weight on the 9th was 95.2 kilograms.  Admission H and  H was 16.0 and 45.3, normal indices, platelets 407, WBC 9.6.  At the  time of discharge, H and H was 15.6 and 44.0, normal indices, platelets  396, WBC 11.8.  PTT was 70, PT 13.8.  Admission sodium 140, potassium  3.9, BUN 14, creatinine 1.10, glucose 110.  Prior to discharge, BUN and  creatinine were 13 and 1.09, potassium 4.1.  Amylase and lipase were 119  and 24 respectively.  CK MBs were elevated, but relative indices were  within normal limits.  Troponins were elevated at 0.32, 0.59 and 0.41.  BNP less than 30.  Fasting lipids on the 9th showed a total cholesterol  of 211,  triglycerides 264, HDL 32, LDL 126.  TSH on the 8th was 0.625.  Chest x-ray on the 8th showed mild cardiac enlargement without acute  pulmonary edema.  EKGs showed sinus bradycardia, normal sinus rhythm,  delayed R-wave, no acute changes.   HOSPITAL COURSE:  Mr. Efferson was admitted to [facility] by  Joni Reining, nurse practitioner, and Dr. [name].  Was placed on  IV heparin which was managed per pharmacy.  CK MBs were elevated with a  normal relative index, however, troponins were positive.  It was felt  that he should undergo a cardiac catheterization.  This was performed by  Dr. Diona Browner.  He had diffuse non-obstructive lesions in the LAD and  RCA; however, he did have a proximal 95% lesion in the OM.  This  received a drug-eluting stent by Dr. Excell Seltzer.  Post-procedure  catheterization, the patient was stable, he was ambulating the halls.  After assessment by Dr. Daleen Squibb, Dr. Daleen Squibb felt that the patient could be  discharged home.   DISPOSITION:  The patient is discharged home.  He is advised he can  return  to work on Monday, June 15th.   WOUND CARE:  Per supplemental sheet.   DISCHARGE ACTIVITIES:  He was advised restrict activity from lifting,  driving, sexual activity for 1 week.   NEW MEDICATIONS:  1. Aspirin 325 mg daily.  2. Plavix 75 mg daily.  3. Nitroglycerin 0.4 as needed.  4. Zocor 40 mg at bedtime.  5. Lopressor 25 mg b.i.d.   DISCHARGE FOLLOWUP:  He will need blood work in 6-8 weeks for followup  and LFTs since Zocor was initiated.  He was asked to bring all  medications to all appointments.  Dr. Daleen Squibb will see him in the  [facility] office on July 28, 2007, at 4 p.m.  He will follow up with  his primary care physician as needed.  The patient was advised that he  will need to be on Plavix for at least 1 year.   DISCHARGE TIME:  Thirty-five minutes.      Joellyn Rued, PA-C      Jesse Sans. Daleen Squibb, MD, [facility]  Electronically Signed    EW/MEDQ  D:   06/25/2007  T:  06/25/2007  Job:  [id number]   cc:   Thomas C. Daleen Squibb, MD, [facility]  Debby Bud, M.D.

## 2010-05-30 NOTE — Assessment & Plan Note (Signed)
[facility]                       [place] CARDIOLOGY OFFICE NOTE   STONEY, KARCZEWSKI                       MRN:          [id number]  DATE:12/08/2007                            DOB:          1950/04/13    Mr. Hogston comes in for followup today.  He saw Tereso Newcomer in the office  a couple of weeks ago and was complaining of some substernal chest  discomfort.   It is not with exertion.  In fact, he feels better with exertion.   Scott started him on ranitidine 150 mg p.o. b.i.d. and he is already  starting to feeling better.  He also scheduled a stress test on December 04, 2007.  This demonstrated an exercise for 13.7 METS, reaching 99% of  his age predicted maximum heart rate.  Blood pressure response was  normal.  He had nonspecific ST-segment changes, EF was 63% with no  ischemia or infarction.   I have sat down with him and his wife today and gone over all the  findings.  I have reassured him I think this is a gastroesophageal and  to stay on the ranitidine.  It may recur once he stops at which he may  need to restart.   We will plan on seeing him back in 6 months.   Please note that he switched from Zocor to Crestor and he will follow up  the lipids in about 4 weeks.     Thomas C. Daleen Squibb, MD, [facility]  Electronically Signed    TCW/MedQ  DD: 12/08/2007  DT: 12/09/2007  Job #: [id number]

## 2010-05-30 NOTE — Assessment & Plan Note (Signed)
[facility]                            CARDIOLOGY OFFICE NOTE   Samuel Tanner, Samuel Tanner                       MRN:          [id number]  DATE:07/28/2007                            DOB:          1951-01-05    Samuel Tanner comes in today for followup.   I have met him initially at [facility] with exertional angina.  He  had been transferred down from [facility] ED.   Cardiac catheterization showed a high-grade obtuse marginal lesion that  was treated with a drug-eluting stent.  He did have positive cardiac  markers.   He subsequently returned to [facility] with chest discomfort.  He  underwent cardiac catheterization, which showed a patent stent with a  minor filling defect at the midpoint of the stent.  There was TIMI III  flow.   He has had no further pain since then.  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   He was having some dizziness, so he stopped his metoprolol on his own.   He continues on Plavix, aspirin, and Zocor.  He asked me about fish oil  and I told him he certainly could take that.   His medications today include Zocor 40 mg nightly, Plavix 75 mg a day,  and enteric-coated aspirin 81 mg a day.  He is carrying sublingual  nitroglycerin.   He is intolerant of MORPHINE, which causes hallucinations.  METOPROLOL  seems to have caused some dizziness.   VITAL SIGNS:  His blood pressure today is 133/86, his pulse 66 and  regular, his weight is 208, and he is 6 feet 1 inch.  HEENT:  Unchanged.  He is bearded.  NECK:  Carotids upstrokes were equal bilaterally without bruits.  No  JVD.  Thyroid is not enlarged.  Trachea is midline.  LUNGS:  Clear.  HEART:  Nondisplaced PMI.  Normal S1 and S2 without murmur.  ABDOMINAL:  Soft.  Good bowel sounds.  No midline bruit.  EXTREMITIES:  No cyanosis, clubbing, or edema.  Cath site is stable.  Pulses are intact.  NEURO:  Intact.   His electrocardiogram today is essentially normal.   ASSESSMENT/PLAN:  Samuel Tanner is doing well.  We have assumed that the  metoprolol was causing some dizziness with postural changes.  He will  stay off this.  I have asked him to stay on aspirin, Plavix, and Zocor.  He is due lipids and LFTs.  He questioned whether or not the Zocor is  even necessary since his cholesterol is always low.  I told him that  it is a relative value and we wanted it as low as possible since he now  has coronary artery disease.   He would like followup in [place], which I will arrange for followup  in 2 months.  All questions were answered.     Thomas C. Daleen Squibb, MD, [facility]  Electronically Signed    TCW/MedQ  DD: 07/28/2007  DT: 07/29/2007  Job #: [id number]   cc:   Madelin Rear. Sherwood Gambler, MD

## 2010-05-30 NOTE — H&P (Signed)
NAME:  Samuel Tanner, Samuel Tanner NO.:  [id number]   MEDICAL RECORD NO.:  [id number]          PATIENT TYPE:  INP   LOCATION:  3705                         FACILITY:  [facility]   PHYSICIAN:  Dorian Pod, ACNP  DATE OF BIRTH:  1950/04/20   DATE OF ADMISSION:  06/27/2007  DATE OF DISCHARGE:                              HISTORY & PHYSICAL   PRIMARY CARDIOLOGIST:  Maisie Fus C. Wall, MD, [facility].   PRIMARY CARE:  [name], MD.   Samuel Tanner is a 60 year old Caucasian gentleman with status post recent  hospitalization on June 23, 2007 for non-ST elevated MI.  During the  hospitalization, he underwent a cardiac catheterization.  He was found  to have diffuse nonobstructive lesions in the LAD and RCA; however he  did have a proximal 95% lesion in the OM, status post drug-eluting stent  by Dr. Excell Seltzer.  The patient was discharged home in stable condition  initially without problems.  Returns today because of recurrent chest  pain.  The patient describes it as uncomfortable feeling in his chest  since the PCI, but today has gotten worse with tightness with radiation  to his jaw similar to but less severe than his MI pain.  It does not  change per activity or position.  He does complain of shortness of  breath with it.  He tried nitroglycerin that made him dizzy, so he went  to [facility] Emergency Room for evaluation.  He was transferred here by  [name], presently pain-free.  EKG obtained at [facility] showed normal  sinus rhythm without acute ST or T-wave changes.   PAST MEDICAL HISTORY:  1. Not any different from previous hospitalization, but also includes      coronary artery disease, non-ST elevated MI status post cath with a      drug-eluting stent to the proximal obtuse marginal.  2. Hyperlipidemia.   SOCIAL HISTORY:  He still lives in [place] with his wife.  He is a  Film/video editor.  Denies any drug, herbal medication, or alcohol  use.  Tries to walk for exercise.   FAMILY HISTORY:  Positive for CABG.  MI in his father.   REVIEW OF SYSTEMS:  Positive for chest pain, shortness of breath.  Other  systems reviewed and negative.   ALLERGIES:  Include, MORPHINE causes hallucinations.   CURRENT MEDICATIONS:  Include;  1. Aspirin 325.  2. Plavix 75.  3. Zocor.   PHYSICAL EXAMINATION:  The patient does not have vitals.  She had here  at [facility] in computer systems and at [facility] this  morning.  His vital signs were as follows; 97.7, blood pressure 108/70,  heart rate 64, respirations 20, sat 99% on room air.  Samuel Tanner is in no  acute distress.  Normocephalic, atraumatic. NECK supple without  lymphadenopathy.  No bruits.  No JVD.  Cardiovascular exam reveals S1  and S2, regular rate and rhythm.  Abdomen soft, nontender, positive  bowel sounds.  Lower extremities without clubbing, cyanosis, or edema.  Lungs are clear to auscultation bilaterally.  Neurologically, alert and  oriented  x3.   Chest x-ray obtained at [facility] shows no acute findings.  Lab work obtained at [facility], point-of-care enzymes negative.  H&H 15.3 and 43.7, potassium 3.5, BUN and creatinine 17 and 1.04.   IMPRESSION:  Chest pain concerning for angina status post recent drug-  eluting stent 2 days ago.  The patient will be admitted to rule out,  will need cardiac catheterization for re-evaluation. Dr. Olga Millers  in to examine and assess the patient, agrees with plan of care.      Dorian Pod, ACNP     MB/MEDQ  D:  06/27/2007  T:  06/27/2007  Job:  [id number]

## 2010-05-30 NOTE — Assessment & Plan Note (Signed)
[facility] HEALTHCARE                       [place] CARDIOLOGY OFFICE NOTE   Samuel Tanner, Samuel Tanner                       MRN:          [id number]  DATE:12/01/2007                            DOB:          05/15/1950    PRIMARY CARE PHYSICIAN:  Samuel Rear. Sherwood Gambler, MD.   PRIMARY CARDIOLOGIST:  Samuel Sans. Wall, MD, [facility].   REASON FOR VISIT:  Chest pain.   HISTORY OF PRESENT ILLNESS:  Samuel Tanner is a 60 year old male patient with  a history of coronary artery disease status post non-ST-elevation  myocardial infarction in June 2009 treated with a drug-eluting stent to  the obtuse marginal on June 9.  He presented back to the hospital  several days later with recurrent symptoms and underwent a relook  cardiac catheterization.  This was done by Dr. Riley Tanner on June 13.  He  had a mid LAD lesion of 40%.  His circumflex stent was patent with 20%  stenosis noted and a small area of filling defect, possibly representing  a small area of plaque tissue prolapse and/or thrombus.  He was treated  medically.  He has had overall preserved LV function.  He last saw Dr.  Daleen Tanner November 10, 2007.  At that time he was complaining of some jaw pain  with exertion.  This was not felt to be cardiac and medical therapy was  continued.  The patient presents to the office now with complaints of  chest tightness over the last couple of weeks.   He notes that just prior to seeing Samuel Tanner October 26, he began to note  chest tightness.  This mainly occurs after meals.  He denies any  exertional symptoms.  He walks almost every morning one to two miles a  day.  He denies any chest symptoms with this.  He also dug a hole for a  fence yesterday without any development of chest pain or shortness of  breath.  Again his symptoms are noted usually after eating.  He does  have some associated shortness of breath.  Denies nausea, diaphoresis.  Denies any syncope.  Denies orthopnea, PND or pedal edema.   He notes  that his symptoms are not like his myocardial infarction pain.  He has  had some jaw symptoms which he did have with his myocardial infarction.  However, these symptoms are not clearly like his previous angina.  He  has used nitroglycerin on occasion with relief.  He has also used over-  the-counter antacids with relief.  He notes fatigue as well.   PAST MEDICAL HISTORY:  Coronary disease as outlined above.  In addition  he has dyslipidemia, history of possible carcinoid status post bowel  resection with radiation and chemotherapy, status post splenectomy,  cholecystectomy, appendectomy.   MEDICATIONS:  Plavix 75 mg daily, ASA 81 mg daily, Crestor 40 mg daily,  NTG prn.   ALLERGIES:  MORPHINE CAUSES HALLUCINATIONS.   SOCIAL HISTORY:  He is a nonsmoker.   FAMILY HISTORY:  Significant for CAD.   REVIEW OF SYSTEMS:  Please see HPI.  Denies fevers, chills, cough,  melena, hematochezia, hematuria, dysuria.  Rest of the review of systems  is negative.   PHYSICAL EXAM:  He is a well-nourished, well-developed male.  Blood  pressure 100/72, pulse 70, weight 208 pounds.  HEENT:  Normal neck without JVD.  CARDIAC:  Normal S1, S2.  Regular rate and rhythm.  LUNGS:  Clear to auscultation bilaterally.  ABDOMEN:  Soft, nontender.  EXTREMITIES:  Without edema.  NEUROLOGIC:  He is alert and oriented x3.  Cranial nerves II-XIII  grossly intact.  SKIN:  Warm and dry.  VASCULAR EXAM:  Dorsalis pedis and posterior tibialis pulses 2+  bilaterally.  ENDOCAINE:  Without thyromegaly.   Electrocardiogram:  Sinus rhythm, heart rate of 70, normal axis, no  acute changes.  No significant change when compared to previous tracing.   ASSESSMENT/PLAN:  1. Chest tightness and shortness of breath in a 60 year old male      patient with a history of coronary artery disease status post non-      ST-elevation myocardial infarction June 2009 treated with a drug-      eluting stent to the circumflex  and overall preserved LV function.      Relook angiography 2 days later demonstrated continued patency of      his stent with some filling defects and nonobstructive disease      elsewhere.  His symptoms are not clearly like his previous angina.      His symptoms seem to the more consistent with gastroesophageal      reflux disease.  However, he has had some jaw pain.  With his      history and the degree to which this is concerning the patient, I      think it is best that we rule out ischemia.  Therefore he will be      set up for a stress Myoview study in next couple of days.  He      already has a followup with Samuel Tanner next week.  He will keep that      appointment.  He knows to contact us or go the emergency room      should there be a change in his symptoms.  I have recommended that      he obtain ranitidine over the counter at 150 mg b.i.d.  He is to      take that on a daily basis regardless of whether or not he has      symptoms.  2. Dyslipidemia.  He will continue on Crestor.   DISPOSITION:  The patient will follow up with Samuel Tanner next week as  outlined above or sooner as needed.   ADDENDUM:  It should also be note that the patient will have  laboratories to include a BMET, CBC and TSH.  He has been complaining of  a lot of fatigue lately in association with his other symptoms.      Samuel Newcomer, PA-C  Electronically Signed      Samuel Friends. Dietrich Pates, MD, [facility]  Electronically Signed   SW/MedQ  DD: 12/01/2007  DT: 12/01/2007  Job #: [id number]   cc:   Samuel Rear. Sherwood Gambler, MD

## 2010-05-30 NOTE — Discharge Summary (Signed)
NAME:  Samuel Tanner, Samuel Tanner NO.:  [id number]   MEDICAL RECORD NO.:  [id number]          PATIENT TYPE:  INP   LOCATION:  3705                         FACILITY:  [facility]   PHYSICIAN:  Bevelyn Buckles. Bensimhon, MDDATE OF BIRTH:  1950/06/25   DATE OF ADMISSION:  06/27/2007  DATE OF DISCHARGE:  06/28/2007                               DISCHARGE SUMMARY   PROCEDURES:  1. Cardiac catheterization.  2. Coronary arteriogram.  3. Left ventriculogram.  4. Intravascular ultrasound of 1 vessel.   PRIMARY FINAL DISCHARGE DIAGNOSES:  1. Chest pain, cardiac catheterization showing no critical disease      this admission, medical therapy recommended.  2. Non-ST segment elevation myocardial infarction with a drug-eluting      stent to the obtuse marginal.  3. Hyperlipidemia.  4. History of intra-abdominal tumor possibly carcinoid, status post      bowel resection with radiation and chemotherapy.  5. Status post splenectomy, cholecystectomy, and appendectomy.  6. Allergy or intolerance to MORPHINE with hallucination.  7. Obesity.  8. Family history of coronary artery disease.   TIME AT DISCHARGE:  35 minutes.   HOSPITAL COURSE:  Samuel Tanner is a 60 year old male with a recent history  of coronary artery disease.  He had a drug-eluting stent to the OM on  June 24, 2007.  He developed recurrent chest pain and came to the  hospital where he was admitted for further evaluation.   He initially went to [facility] but was transferred to [facility].  He  was taken to the cath lab where cardiac catheterization showed 40% LAD  and good apposition of the stent previously placed in the OM.  There was  a minor filling defect in the mid point of the stent but there was good  apposition in TIMI-3 flow, so no further workup was indicated.   On September 28, 2007, Samuel Tanner had some chest pain but it resolved with  GI medications.  His cath site was without ecchymosis or hematoma.  His  white count was  slightly elevated at 14400 thousand and urinalysis  pending at the time of dictation.  He was seen by cardiac rehab and  encouraged to continue rehab as an outpatient.  Dr. Gala Romney felt he  could be safely discharged home with continued outpatient followup.   DISCHARGE INSTRUCTIONS:  His activity level is to be increased gradually  with no lifting for a week and no driving for 2 days.  He is to stick to  a low-sodium and heart-healthy diet.  He is to call our office for any  problems with the cath site.  He is to return to Dr. Daleen Squibb in our office,  we will call him.  He is to return to Dr. Sherwood Gambler as needed.   DISCHARGE MEDICATIONS:  1. Zocor 40 mg daily.  2. Plavix 75 mg b.i.d. for 1 week then daily.  3. Aspirin 81 mg daily.  4. Zantac 150 mg b.i.d.  5. Nitroglycerin sublingual p.r.n.  6. Metoprolol 25 mg b.i.d.      Theodore Demark, PA-C  Bevelyn Buckles. Bensimhon, MD  Electronically Signed    RB/MEDQ  D:  06/28/2007  T:  06/28/2007  Job:  [id number]   cc:   Madelin Rear. Sherwood Gambler, MD

## 2010-05-30 NOTE — Assessment & Plan Note (Signed)
[facility] HEALTHCARE                       [place] CARDIOLOGY OFFICE NOTE   TRAYVON, TRUMBULL                       MRN:          [id number]  DATE:12/01/2007                            DOB:          03/13/1950       Tereso Newcomer, PA-C  Electronically Signed      Gerrit Friends. Dietrich Pates, MD, [facility]  Electronically Signed   SW/MedQ  DD: 12/01/2007  DT: 12/01/2007  Job #: [id number]

## 2010-05-30 NOTE — Assessment & Plan Note (Signed)
[facility] HEALTHCARE                       [place] CARDIOLOGY OFFICE NOTE   BENARD, MINTURN                       MRN:          [id number]  DATE:02/11/2008                            DOB:          September 17, 1950    Ms. Brahmbhatt comes in today for followup.  His symptoms responded to Zantac  which he now takes p.r.n.  He clearly has reflux.   A stress Myoview on February 03, 2008, showed EF of 63% with no ischemia  or scar.  He has excellent exercise tolerance.  [place]  [place].   His lipids were at goal and I have reviewed them with him today.  These  were drawn on January 05, 2008.  LFTs were normal.   His meds are Plavix 75 mg a day, enteric-coated aspirin 81 mg a day,  Crestor 40 mg a day, sublingual nitroglycerin p.r.n.   PHYSICAL EXAMINATION:  VITAL SIGNS:  His blood pressure is 100/80, his  pulse 68 and regular, his weight is 217 up 10.  HEENT:  Other than a  beard, unremarkable.  NECK:  Supple.  Carotid upstrokes are equal bilaterally without bruits.  No JVD.  Thyroid is not enlarged.  Trachea is midline.  CHEST:  Lungs are clear to auscultation and percussion.  HEART:  A nondisplaced PMI.  Normal S1 and S2.  No gallop.  ABDOMEN:  Soft.  Good bowel sounds.  No midline or pulsatile mass.  EXTREMITIES:  There were no cyanosis, clubbing, or edema.  Pulses are  brisk.  NEUROLOGIC:  Intact.  SKIN:  Unremarkable.   ASSESSMENT AND PLAN:  Mr. Acree is doing remarkably well.  We will plan  on seeing him back in November, at which time he will need blood work in  followup objective assessment of his coronary disease.     Thomas C. Daleen Squibb, MD, [facility]  Electronically Signed    TCW/MedQ  DD: 02/11/2008  DT: 02/12/2008  Job #: [id number]

## 2010-05-30 NOTE — Assessment & Plan Note (Signed)
Samuel Tanner                       [place] CARDIOLOGY OFFICE NOTE   Samuel Tanner, Samuel Tanner                       MRN:          [id number]  DATE:11/10/2007                            DOB:          16-Oct-1950    Samuel Tanner returns today for followup of his coronary artery disease.  He  has been having some left jaw pain, but this is not just with exertion.  This happens most of the time at rest.  He has been doing a lot of  physical work around his [place] and [place] with no pain.   He has known coronary artery disease, mixed hyperlipidemia.  He had a  relook catheterization on June 27, 2007, which showed a patent stent in  the circumflex.  Normal appearing right coronary artery, mild disease in  the left anterior descending.  His LV function was good.   CURRENT MEDICATIONS:  1. Zocor 40 mg a day.  2. Plavix 75 mg a day.  3. Aspirin 81 mg a day.  4. Sublingual nitroglycerin.   His recent lipids showed a total cholesterol of 175, his LDL was 100,  VLDL is 32, triglycerides dropped to 159, and HDL was increased from 32-  43.  His LFTs were normal.   PHYSICAL EXAMINATION:  VITAL SIGNS:  His blood pressure is 127/82, his  pulse is 69 and regular, and weight is 215, which is up 7.  HEENT:  Normal.  There is no jaw tenderness.  NECK:  Carotid upstrokes were equal bilaterally without bruits.  No JVD.  Thyroid is not enlarged.  Trachea is midline.  LUNGS:  Clear.  HEART:  Nondisplaced PMI.  Normal S1 and S2.  No gallop.  ABDOMINAL:  Soft.  Good bowel sounds.  No midline bruit.  No  hepatomegaly.  EXTREMITIES:  No cyanosis, clubbing, or edema.  Pulses are intact.   ASSESSMENT AND PLAN:  Samuel Tanner is stable from my standpoint.  I do not  think his jaw discomfort represents coronary ischemia.   We need more aggressive lowering of his lipids.  I have made the  following changes.  1. Change either Crestor 40 or Lipitor 80 depending on his payment      plan and  decreasing his cost.  2. Discontinue Zocor.  3. Follow lipids and LFTs in 6 weeks.  4. See me back in 3 months.     Thomas C. Daleen Squibb, MD, [facility]  Electronically Signed    TCW/MedQ  DD: 11/10/2007  DT: 11/10/2007  Job #: [id number]   cc:   Madelin Rear. Sherwood Gambler, MD

## 2010-05-30 NOTE — H&P (Signed)
NAME:  Samuel Tanner, [name]:  [id number]   MEDICAL RECORD NO.:  [id number]          PATIENT TYPE:  INP   LOCATION:  2041                         FACILITY:  [facility]   PHYSICIAN:  Jesse Sans. Wall, MD, FACCDATE OF BIRTH:  January 05, 1951   DATE OF ADMISSION:  06/23/2007  DATE OF DISCHARGE:                              HISTORY & PHYSICAL   PRIMARY CARDIOLOGIST:  Samuel Fus C. Daleen Squibb, MD, [facility]   PRIMARY CARE PHYSICIAN:  Samuel Tanner, M.D.   REASON FOR ADMISSION:  Chest pain.   HISTORY OF PRESENT ILLNESS:  A 60 year old Caucasian male with no prior  history of coronary artery disease, but did have a cardiac  catheterization 5 years ago by [facility] revealing no blockages.  Review of cardiac catheterization does confirm but the patient wishes to  establish with [facility] as his father is a patient here.  Apparently 3  weeks ago while doing some heavy yard work and burning of some bush  around the pond, he felt out of breath and weak and it went away with  rest.  Approximately 2 weeks ago after walking a mile, he became short  of breath and had some weakness with some mild pressure in his chest.  One week ago while walking up hill, he felt chest pressure and shortness  of breath and it went away again with rest.  Had not felt good since the  last episode of chest discomfort and has been easily out of breath with  minimal exertion.  He went to Dr. Bonney Leitz office and saw PA there on  Friday, June 21, 2007.  EKG was completed and physical exam was completed  and everything was normal per the patient, but he advised the patient to  go to [facility] because of his recurrent symptoms.  He  did go to [facility], but did not wait to be seen as there was a lot of  people there and went on home.  After he got home, over the weekend, he  began to feel again some exertional chest pressure and shortness of  breath, and today while walking before work he had again some chest  pressure.  He went to [facility] and was seen and evaluated by  a physician in the ER and had been advised to go to [facility].  In the interim, he was placed on a nitroglycerin drip and a heparin  drip.  Cardiac enzymes were cycled and found to be negative for point-of-  care.  The patient was accepted by Dr. Juanito Tanner from [facility] and he  is now admitted to [facility] and is planned for cardiac catheterization  in the a.m.   REVIEW OF SYSTEMS:  Positive for some diaphoresis, chest pain, shortness  of breath, dyspnea on exertion and chronic neck pain, arthralgia for  last several years.   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  The  patient has been cancer-free for the last 12 years after CT scans yearly  to evaluate the patient.   PAST SURGICAL HISTORY:  Splenectomy, cholecystectomy, appendectomy, and  a colectomy with anastomosis in 1996.   SOCIAL HISTORY:  Socially, he lives in [place] with his wife.  He  works part-time at [employer], he is married, he does not smoke, does  not drink alcohol.  He exercises daily by walking   FAMILY HISTORY:  Mother died of Alzheimer's and questionable myocardial  infarction.  Father with CAD, coronary artery bypass grafting, and  history of CVA.  He had one brother with bone cancer who died from same,  and one brother who is in good health.   CURRENT MEDICATIONS:  At home, Aleve p.r.n.   ALLERGIES:  Morphine causing hallucinations.   CURRENT LABS:  Hemoglobin 16.1, hematocrit 45.3, white blood cells 9.6,  and platelets 407.  Sodium 140, potassium 3.9, chloride 107, CO2 26, BUN  14, creatinine 1.1, glucose 110, BNP less than 30, CK 112, MB 2.5, and  troponin less than 0.05.  Chest x-ray revealing mild cardiac enlargement  without acute pulmonary  abnormality.  EKG revealing normal sinus rhythm  with a ventricular rate of 65 beats per minute with some T-wave  flattening noted in V1 and lead III.   PHYSICAL EXAMINATION:  VITALS:  Blood pressure 122/77, heart rate 67,  respirations 17, temperature 97.5, and O2 sat 100% on 2 L.  GENERAL:  He is awake, alert, and oriented.  No acute distress.  HEENT:  Head is normocephalic, atraumatic.  Eyes:  PERRLA.  Mucous  membranes, mouth pink and moist.  Tongue is midline.  NECK:  Supple.  There is no JVD.  No carotid bruits appreciated.  CARDIOVASCULAR:  Regular rate and rhythm without murmurs, rubs, or  gallops.  Pulses are 2+ and equal bilaterally without bruits.  LUNGS:  Clear to auscultation without wheezes, rales, or rhonchi.  ABDOMEN:  Soft, nontender, 2+ bowel sounds.  No rebound or guarding is  noted.  EXTREMITIES:  Without clubbing, cyanosis, or edema.  NEURO:  Cranial nerves II-XII are grossly intact.   IMPRESSION:  1. Exertional angina progressive times 3 weeks.  2. Rule out reactive airway disease.   PLAN:  The patient is a 60 year old Caucasian male with no known history  of coronary artery disease with clean cardiac catheterization in 2004  per [facility] with exertional angina and shortness of breath.  We  will continue cardiac enzymes cycling, check lipids and LFTs for risk  stratification and monitor EKG and telemetry for arrhythmias.  We will  continue heparin and nitroglycerin drip, planned cardiac catheterization  in the a.m., also check amylase and lipase.  The patient will have more  recommendations based upon hospital course, cardiac catheterization  results and Dr. Vern Claude further evaluation and treatment plan.  If the  patient has normal cath he may need pulmonary consultation with PFTs,  etc, at Dr. Vern Claude discretion.  We will make further recommendations  after cath was completed.      [name]. [name], NP      Jesse Sans. Daleen Squibb, MD, [facility]   Electronically Signed    KML/MEDQ  D:  06/23/2007  T:  06/24/2007  Job:  [id number]   cc:   Samuel Tanner, M.D.

## 2010-05-31 ENCOUNTER — Ambulatory Visit: Payer: Self-pay | Admitting: Cardiology

## 2010-06-02 NOTE — Discharge Summary (Signed)
   NAME:  Samuel Tanner, HOLLENBACH                          ACCOUNT NO.:  [id number]   MEDICAL RECORD NO.:  [id number]                   PATIENT TYPE:  INP   LOCATION:  3703                                 FACILITY:  [facility]   PHYSICIAN:  Madelin Rear. Sherwood Gambler, M.D.             DATE OF BIRTH:  03/12/1950   DATE OF ADMISSION:  09/14/2001  DATE OF DISCHARGE:  09/17/2001                                 DISCHARGE SUMMARY   DISCHARGE MEDICATIONS:  1. Aspirin 325 mg p.o. q.d.  2. Xanax 0.25 mg p.o. q.h.s. p.r.n.  3. Metoprolol 25 mg p.o. q.d.   DISCHARGE DIAGNOSES:  1. Atypical chest pain.  2. Hyperlipidemia.   SUMMARY:  The patient was admitted with atypical intermittent chest pain.  He was admitted for telemetry and rule out myocardial infarction protocol.  He was seen in consultation by cardiology.  EKG's and serial enzymes were  negative.  He was noted to be hyperlipidemic and hypertriglyceridemic with a  triglyceride of 195, LDL of 150, a total cholesterol of 232, and HDL of 43.  H. pylori was also obtained due to the atypical nature and possible reflux  etiology of his chest pain and this was negative for H. pylori serology.  He  was discharged uneventfully for followup with cardiology.                                               Madelin Rear. Sherwood Gambler, M.D.    LJF/MEDQ  D:  11/04/2001  T:  11/04/2001  Job:  [id number]

## 2010-06-02 NOTE — Cardiovascular Report (Signed)
   NAME:  Samuel Tanner, Samuel Tanner                          ACCOUNT NO.:  [id number]   MEDICAL RECORD NO.:  [id number]                   PATIENT TYPE:  INP   LOCATION:  3703                                 FACILITY:  [facility]   PHYSICIAN:  Madaline Savage, M.D.             DATE OF BIRTH:  12/03/1950   DATE OF PROCEDURE:  09/17/2001  DATE OF DISCHARGE:  09/17/2001                              CARDIAC CATHETERIZATION   PROCEDURES PERFORMED:  1. Selective coronary angiography by Judkins technique.  2. Retrograde left heart catheterization.  3. Left ventricular angiography.   COMPLICATIONS:  None.   ENTRY SITE:  Right femoral.   DYE USED:  Omnipaque.   PATIENT PROFILE:  The patient is a 60 year old gentleman who developed chest  pain while mowing his yard yesterday in [place], [place] and  ultimately proceeded to the Emergency Room at [facility].  He was evaluated there and ultimately transferred to [facility] for  cardiac catheterization. He has previously had an intra-abdominal tumor that  was thought to be a benign carcinoid. He had a history of heart murmur and  some asymptomatic gallstones.  Today's procedure is performed on an  inpatient basis electively.   RESULTS:  PRESSURES:  The left ventricular pressure was 110/11.  Central  aortic pressure was 110/68 with a mean of 85.  No aortic valve gradient by  pullback technique.   ANGIOGRAPHIC RESULTS:  The left main coronary artery is normal. It is fairly  short. No lesions were seen.   The left anterior descending coronary artery courses to the cardiac apex  giving rise to several septal perforator branches and one major diagonal  branch. No lesions were seen in the LAD.   The left circumflex was basically comprised of a single large obtuse  marginal branch and a very small circumflex proper.   The right coronary artery was dominant. No lesions were seen in the RCA and  its branches.   The left  ventricle shows vigorous contractility. I estimate ejection  fraction 60-65%.  No wall motion abnormalities are noted and no mitral  regurgitation is seen.   ABDOMINAL AORTOGRAPHY:  Abdominal aortography shows a smooth abdominal aorta  and normal renal arteries.   FINAL DIAGNOSES:  1. Angiographically patent coronary arteries.  2.     Normal left ventricular systolic function.  3. Normal renal arteries.  4. Normal abdominal aorta.                                                 Madaline Savage, M.D.    WHG/MEDQ  D:  09/17/2001  T:  09/18/2001  Job:  [id number]

## 2010-06-02 NOTE — Discharge Summary (Signed)
NAME:  Samuel Tanner, Samuel Tanner                          ACCOUNT NO.:  [id number]   MEDICAL RECORD NO.:  [id number]                   PATIENT TYPE:  INP   LOCATION:  3703                                 FACILITY:  [facility]   PHYSICIAN:  Sherral Hammers, M.D.               DATE OF BIRTH:  20-Sep-1950   DATE OF ADMISSION:  09/16/2001  DATE OF DISCHARGE:  09/17/2001                                 DISCHARGE SUMMARY   ADMISSION DIAGNOSES:  1. Unstable angina.  2. Degenerative joint disease of the neck.  3. History of heart murmur.  4. History of some type of intra-abdominal tumor, ? benign versus carcinoid     (seven years ago), status post bowel resection, now disease-free.  5. History of asymptomatic gallstones.  6. History of tonsillectomy.   DISCHARGE DIAGNOSES:  1. Unstable angina.  2. Degenerative joint disease of the neck.  3. History of heart murmur.  4. History of some type of intra-abdominal tumor, ? benign versus carcinoid     (seven years ago), status post bowel resection, now disease-free.  5. History of asymptomatic gallstones.  6. History of tonsillectomy.  7. Chest pain, questionable etiology.  8. Status post cardiac catheterization on September 17, 2001, revealing     normal coronary arteries, normal left ventricular function.  9. Hyperlipidemia with LDL 150.   HISTORY OF PRESENT ILLNESS:  The patient is a 60 year old white male with a  history of questionable cancer about seven years ago which required surgical  resection.  He had no known cardiac risk factors other than newly found  hyperlipidemia as well as a positive family history of CAD.  He presented to  [facility] on [facility], 2001 with complaints of left-sided chest pain  with radiation to the left arm.  It was described as an achy sensation.  It  lasted for about 30 minutes at the time.  There is associated shortness of  breath and nausea, plus/minus diaphoresis; as well, he had been experiencing  some  palpitations with questionable dizziness.  There was no lower extremity  edema, no PND, no orthopnea.   As well, at his stay at [facility], he had a run of wide complex tachycardia  for 11 beats.  Dr. Sherral Hammers was then called for consultation of his  angina and tachycardia.   At that time, she felt that he should be transferred to [facility]  and undergo definitive cardiac catheterization.  The patient will be  continued on Lovenox as well as beta blocker and we will check serial  enzymes to rule out MI.  Risks and benefits of the procedure were discussed  with the patient and he is willing to proceed.   HOSPITAL COURSE:  On September 17, 2001, the patient is doing well with no  further chest pain or shortness of breath.  Labs remain stable.  He had been  transferred for cath.   On September 17, 2001, the patient underwent cardiac catheterization by Dr.  Madaline Savage.  He was found to have normal coronary arteries, normal LV  function with EF 65%, normal abdominal aorta.  Dr. Elsie Lincoln planned for four  hours of bedrest.  If the patient remained stable, then he would be  discharged home after the four hours of rest.   HOSPITAL CONSULTANTS:  None.   HOSPITAL PROCEDURES:  Cardiac catheterization on September 17, 2001 by Dr.  Lavonne Chick revealing normal coronary arteries, normal left ventricular  function, ejection fraction 65%, normal abdominal aorta.  The patient  tolerated the procedure well and there was no complication.   LABORATORY AND ACCESSORY CLINICAL DATA:  White count 10.8, hemoglobin 15.4,  hematocrit 44.9, platelets 431,000.  PT 13.0, INR 0.9, PTT 33.  Liver  function tests are normal here with total bilirubin 1.1.  Cardiac enzymes  negative with CKs of 284, 210, 189, 143 and 159.  MB 2.6, 1.9, 1.8, 1.7 and  1.6.  Troponin 0.01, 0.01, less than 0.01, less than 0.01, 0.01.  Total  cholesterol 232, triglycerides 195, HDL 43, LDL 150.   EKG shows normal  sinus rhythm, nonspecific ST-T change.  While at [facility]  on September 15, 2001, the patient did have an 11-beat run of wide complex  tachycardia.  No further arrhythmias while at [facility].   DISCHARGE MEDICATIONS:  Pravachol 20 mg one each night.   ACTIVITY:  No strenuous activity, lifting greater than 5 pounds, driving or  sexual activity for three days.   DIET:  Low-salt, low-fat, low-cholesterol, low-saturated-fat diet.   WOUND CARE:  May gently wash the groin site with warm water and soap.   Call [phone] if any bleeding or increased size or pain at groin site.   FOLLOWUP:  Follow up with Dr. Domingo Sep at the [facility] office, September 30, 2000 at 2 p.m.     Mary B. Easley, P.A.-C.                   Sherral Hammers, M.D.    MBE/MEDQ  D:  09/17/2001  T:  09/19/2001  Job:  [id number]   cc:   Madelin Rear. Sherwood Gambler, M.D.  [address]  [place]  [place] [address]  Fax: [phone]

## 2010-06-02 NOTE — H&P (Signed)
NAME:  Samuel Tanner, Samuel Tanner                          ACCOUNT NO.:  [id number]   MEDICAL RECORD NO.:  [id number]                   PATIENT TYPE:  INP   LOCATION:  A203                                 FACILITY:  [facility]   PHYSICIAN:  Isidor Holts, M.D.               DATE OF BIRTH:  February 15, 1950   DATE OF ADMISSION:  09/14/2001  DATE OF DISCHARGE:                                HISTORY & PHYSICAL   CHIEF COMPLAINT:  Recurrent chest pain for one week.   HISTORY OF PRESENT ILLNESS:  This is a 60 year old Caucasian male whose  symptoms started approximately one week ago.  On September 07, 2001 after  church service while watching T.V. at home, he felt a sudden left precordial  discomfort which he described as pressure and left arm heaviness which  lasted approximately 15 minutes.  Not associated with shortness of breath or  nausea or sweating.  Symptoms continued on and off since then.  He went to  his primary M.D.'s office on September 10, 2001, was seen by the physician  assistant, EKG was done which was reportedly normal, and stress test was  arranged for September 17, 2001.  He had further remained symptomatic.  On  September 13, 2001 after mowing the lawn, had a severe episode which lasted  approximately 15 minutes.  During this he was diaphoretic, short of breath,  and looked ashen according to his spouse.  On day of admission, i.e.  September 14, 2001 at about 5 p.m., he had another episode and subsequently  came to the emergency room at [facility].   PAST MEDICAL HISTORY:  1. DJD of the neck for the past few weeks.  2. Heart murmur noted 35 years ago.  3. Intraabdominal tumor, ??? carcinoid, seven years ago, status post bowel     resection and chemotherapy, now disease free.  4. Asymptomatic gallstones.  5. Status post tonsillectomy in childhood.   ALLERGIES:  No known drug allergies.  However, he is intolerant of MORPHINE  which causes hallucinations.   MEDICATIONS:  Nothing regular  but takes Aleve p.r.n. over-the-counter.   SOCIAL HISTORY:  Married with two offspring, both of whom are alive and  well.  He is a nonsmoker.  Denies alcohol use or drug abuse.   FAMILY HISTORY:  His father had an MI at age 58 years, his brother has CHF,  and his mother has diabetes mellitus.   PHYSICAL EXAMINATION:  VITAL SIGNS:  Temperature 97.9, pulse 69,  respirations 16, blood pressure 155/89.  GENERAL:  Not short of breath at rest.  Pain-free after sublingual NTG in  the emergency room.  Communicative.  HEENT:  No clinical pallor, no jaundice, no conjunctival injection.  Throat  is clear.  NECK:  Supple. There is no palpable lymphadenopathy, no palpable goiter.  JVP is not seen.  No bruits are heard.  CHEST:  Clinically clear to auscultation.  No wheezes or crackles.  HEART:  Heart sounds 1 and 2 heard.  Soft systolic murmur heard at the apex.  Rhythm regular.  ABDOMEN:  Reveals old midline laparotomy scar.  Abdomen is flat, soft, and  nontender.  There is no palpable organomegaly.  Bowel sounds are normal.  LOWER EXTREMITIES:  Unremarkable.   LABORATORY INVESTIGATIONS:  Chest x-ray:  Unremarkable.  EKG:  Sinus rhythm,  regular, 69 per minute, normal axis, no acute ischemic changes.   Total CK 284, CK-MB 2.6, troponin I 0.01.  Chemistry:  Sodium 139, potassium  4.2, chloride 107, CO2 26, BUN 19, creatinine 1.3, glucose 139.  LFTs  normal.   ASSESSMENT AND PLAN:  Admit to telemetry unit.   1. Chest pain, recurrent, rule out unstable angina.  Will complete rule out     MI protocol, commence nitrates, aspirin, Lovenox, do a lipid profile and     TSH, and request cardiology consult to be provided by the [facility]     group.  2. History of heart murmur/asymptomatic cholelithiasis.  3. Mild hypertension.  Will commence treatment with Toprol-XL 25 mg p.o.     q.d. for now and monitor.   Further management will of course depend on clinical course.                                                Isidor Holts, M.D.    CO/MEDQ  D:  09/15/2001  T:  09/15/2001  Job:  [id number]   cc:   Madelin Rear. Sherwood Gambler, M.D.  [address]  [place]  [place] [address]  Fax: [phone]

## 2010-06-02 NOTE — Op Note (Signed)
NAME:  RUFFUS, Samuel Tanner                          ACCOUNT NO.:  [id number]   MEDICAL RECORD NO.:  [id number]                   PATIENT TYPE:  AMB   LOCATION:  DAY                                  FACILITY:  [facility]   PHYSICIAN:  Lionel December, M.D.                 DATE OF BIRTH:  08-Apr-1950   DATE OF PROCEDURE:  04/20/2003  DATE OF DISCHARGE:                                 OPERATIVE REPORT   PROCEDURE:  Total colonoscopy.   ENDOSCOPIST:  Lionel December, M.D.   INDICATIONS:  Darryel (CD) is a 60 year old Caucasian male who is undergoing  surveillance colonoscopy.  His family history is positive for colon  carcinoma.  His last colonoscopy was in July 2002 when he had a polyp  removed from his transverse colon which was a tubular adenoma with high-  grade dysplasia.  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  The procedure and risks  were reviewed with the patient and informed consent was obtained.   PREOPERATIVE MEDICATIONS:  Demerol 50 mg IV and Versed 6 mg IV.   FINDINGS:  Procedure performed in endoscopy suite.  The patient's vital  signs and O2 saturation were monitored during the procedure and remained  stable.  The patient was placed in the left lateral recumbent position and  rectal examination was performed.  No abnormality noted on external or  digital exam.   Olympus videoscope was placed in the rectum and advanced under vision into  the sigmoid colon.  Colorectal anastomosis located at 20 cm from the anal  margin.  There were a few tiny diverticula proximal to the anastomosis.  The  scope was passed to the cecum which was identified by ileocecal valve and  appendiceal orifice.  Short segment of TI was also examined and was normal.  Preparation was felt to be excellent.  As the scope was withdrawn the  colonic mucosa was carefully examined.  There are no polyps of other  abnormalities.  Rectal mucosa was  normal.   The scope was retroflexed while in the rectum to examine the anorectal  junction which is unremarkable.  The endoscope was straightened and  withdrawn.  The patient tolerated the procedure well.   FINAL DIAGNOSES:  1. Few tiny diverticula proximal to colorectal anastomosis, otherwise normal     colonoscopy.  2. No evidence of recurrent polyps.   RECOMMENDATIONS:  1. He should continue yearly hemoccults.  2. High fiber diet.  3. He should consider having another exam in 5 years from now.      ___________________________________________                                            Lionel December, M.D.   NR/MEDQ  D:  04/20/2003  T:  04/20/2003  Job:  [id number]   cc:   Madelin Rear. Sherwood Gambler, M.D.  [address]  [place]  [place] [address]  Fax: [phone]

## 2010-06-19 DIAGNOSIS — R0789 Other chest pain: Secondary | ICD-10-CM | POA: Insufficient documentation

## 2010-06-19 DIAGNOSIS — D3A020 Benign carcinoid tumor of the appendix: Secondary | ICD-10-CM | POA: Insufficient documentation

## 2010-06-19 DIAGNOSIS — E785 Hyperlipidemia, unspecified: Secondary | ICD-10-CM | POA: Insufficient documentation

## 2010-06-20 ENCOUNTER — Ambulatory Visit: Payer: Self-pay | Admitting: Cardiology

## 2010-07-10 ENCOUNTER — Other Ambulatory Visit (HOSPITAL_COMMUNITY): Payer: BC Managed Care – PPO

## 2010-07-12 ENCOUNTER — Ambulatory Visit (HOSPITAL_COMMUNITY): Payer: BC Managed Care – PPO | Admitting: Oncology

## 2010-07-17 ENCOUNTER — Other Ambulatory Visit (HOSPITAL_COMMUNITY): Payer: Self-pay | Admitting: Oncology

## 2010-07-17 ENCOUNTER — Encounter (HOSPITAL_COMMUNITY): Payer: BC Managed Care – PPO | Attending: Oncology

## 2010-07-17 DIAGNOSIS — D473 Essential (hemorrhagic) thrombocythemia: Secondary | ICD-10-CM

## 2010-07-17 DIAGNOSIS — D509 Iron deficiency anemia, unspecified: Secondary | ICD-10-CM | POA: Insufficient documentation

## 2010-07-17 LAB — CBC
HCT: 47.4 % (ref 39.0–52.0)
MCH: 31.6 pg (ref 26.0–34.0)
MCV: 93.5 fL (ref 78.0–100.0)
Platelets: 436 10*3/uL — ABNORMAL HIGH (ref 150–400)

## 2010-07-17 LAB — DIFFERENTIAL
Basophils Absolute: 0.1 10*3/uL (ref 0.0–0.1)
Basophils Relative: 1 % (ref 0–1)
Eosinophils Absolute: 0.6 10*3/uL (ref 0.0–0.7)
Eosinophils Relative: 6 % — ABNORMAL HIGH (ref 0–5)
Monocytes Absolute: 1.2 10*3/uL — ABNORMAL HIGH (ref 0.1–1.0)

## 2010-07-17 LAB — IRON AND TIBC
Iron: 102 ug/dL (ref 42–135)
Saturation Ratios: 31 % (ref 20–55)
TIBC: 330 ug/dL (ref 215–435)
UIBC: 228 ug/dL

## 2010-07-18 ENCOUNTER — Encounter (HOSPITAL_COMMUNITY): Payer: BC Managed Care – PPO | Admitting: Oncology

## 2010-07-18 DIAGNOSIS — D509 Iron deficiency anemia, unspecified: Secondary | ICD-10-CM

## 2010-09-07 ENCOUNTER — Other Ambulatory Visit (HOSPITAL_COMMUNITY): Payer: BC Managed Care – PPO

## 2010-09-08 ENCOUNTER — Ambulatory Visit (HOSPITAL_COMMUNITY): Payer: BC Managed Care – PPO | Admitting: Oncology

## 2010-10-12 LAB — POCT CARDIAC MARKERS
CKMB, poc: 2
CKMB, poc: 2.5
Myoglobin, poc: 152
Myoglobin, poc: 86.4
Troponin i, poc: <0.05

## 2010-10-12 LAB — COMPREHENSIVE METABOLIC PANEL
Albumin: 3.5
Alkaline Phosphatase: 59
BUN: 16
Calcium: 8.8
Potassium: 3.6
Sodium: 142
Total Protein: 6.3

## 2010-10-12 LAB — BASIC METABOLIC PANEL
BUN: 13
BUN: 16
BUN: 17
CO2: 26
Calcium: 8.7
Calcium: 9.3
Chloride: 107
Chloride: 108
Creatinine, Ser: 1.07
Creatinine, Ser: 1.09
GFR calc non Af Amer: >60
GFR calc non Af Amer: >60
GFR calc non Af Amer: >60
Glucose, Bld: 110 — ABNORMAL HIGH
Glucose, Bld: 116 — ABNORMAL HIGH
Potassium: 3.9
Sodium: 137
Sodium: 140

## 2010-10-12 LAB — URINE CULTURE
Colony Count: 8000
Special Requests: NEGATIVE

## 2010-10-12 LAB — CBC
HCT: 42.4
HCT: 44
HCT: 45.3
Hemoglobin: 16
MCHC: 34.2
MCV: 93.6
MCV: 94.5
MCV: 94.6
Platelets: 396
Platelets: 406 — ABNORMAL HIGH
Platelets: 412 — ABNORMAL HIGH
Platelets: 438 — ABNORMAL HIGH
RDW: 12.1
RDW: 12.2
RDW: 12.3
RDW: 12.4
WBC: 11.2 — ABNORMAL HIGH

## 2010-10-12 LAB — CARDIAC PANEL(CRET KIN+CKTOT+MB+TROPI)
CK, MB: 1.7
Relative Index: 0.8
Relative Index: 1
Relative Index: 1.9
Troponin I: 0.16 — ABNORMAL HIGH
Troponin I: 0.16 — ABNORMAL HIGH
Troponin I: 0.41 — ABNORMAL HIGH
Troponin I: 0.59

## 2010-10-12 LAB — DIFFERENTIAL
Basophils Absolute: 0
Basophils Absolute: 0.2 — ABNORMAL HIGH
Eosinophils Absolute: 0.4
Eosinophils Relative: 4
Lymphocytes Relative: 34
Lymphocytes Relative: 42
Monocytes Absolute: 1
Neutro Abs: 6.4

## 2010-10-12 LAB — LIPID PANEL
Cholesterol: 211 — ABNORMAL HIGH
HDL: 32 — ABNORMAL LOW
Total CHOL/HDL Ratio: 6.6

## 2010-10-12 LAB — HEPARIN LEVEL (UNFRACTIONATED): Heparin Unfractionated: 0.36

## 2010-10-12 LAB — URINALYSIS, ROUTINE W REFLEX MICROSCOPIC
Glucose, UA: NEGATIVE
Hgb urine dipstick: NEGATIVE
Protein, ur: NEGATIVE
pH: 6

## 2010-10-12 LAB — B-NATRIURETIC PEPTIDE (CONVERTED LAB)
Pro B Natriuretic peptide (BNP): <30
Pro B Natriuretic peptide (BNP): <30

## 2010-10-12 LAB — PROTIME-INR
INR: 1
Prothrombin Time: 13.8

## 2010-10-12 LAB — APTT: aPTT: 70 — ABNORMAL HIGH

## 2010-10-12 LAB — AMYLASE: Amylase: 119

## 2010-12-27 ENCOUNTER — Other Ambulatory Visit: Payer: Self-pay | Admitting: Cardiology

## 2011-01-15 ENCOUNTER — Encounter (HOSPITAL_COMMUNITY): Payer: BC Managed Care – PPO | Attending: Oncology

## 2011-01-15 DIAGNOSIS — Z9089 Acquired absence of other organs: Secondary | ICD-10-CM | POA: Insufficient documentation

## 2011-01-15 DIAGNOSIS — D500 Iron deficiency anemia secondary to blood loss (chronic): Secondary | ICD-10-CM | POA: Insufficient documentation

## 2011-01-15 DIAGNOSIS — D72829 Elevated white blood cell count, unspecified: Secondary | ICD-10-CM

## 2011-01-15 DIAGNOSIS — D473 Essential (hemorrhagic) thrombocythemia: Secondary | ICD-10-CM | POA: Insufficient documentation

## 2011-01-15 LAB — DIFFERENTIAL
Basophils Absolute: 0.1 10*3/uL (ref 0.0–0.1)
Basophils Relative: 1 % (ref 0–1)
Eosinophils Absolute: 0.5 10*3/uL (ref 0.0–0.7)
Neutro Abs: 4.2 10*3/uL (ref 1.7–7.7)
Neutrophils Relative %: 37 % — ABNORMAL LOW (ref 43–77)

## 2011-01-15 LAB — CBC
Hemoglobin: 16.4 g/dL (ref 13.0–17.0)
MCH: 31.8 pg (ref 26.0–34.0)
MCHC: 34 g/dL (ref 30.0–36.0)
Platelets: 468 10*3/uL — ABNORMAL HIGH (ref 150–400)
RDW: 12.9 % (ref 11.5–15.5)

## 2011-01-15 NOTE — Progress Notes (Signed)
Labs drawn today for cbc/diff,ferr 

## 2011-01-17 ENCOUNTER — Encounter (HOSPITAL_COMMUNITY): Payer: BC Managed Care – PPO | Attending: Oncology | Admitting: Oncology

## 2011-01-17 ENCOUNTER — Encounter (HOSPITAL_COMMUNITY): Payer: Self-pay | Admitting: Oncology

## 2011-01-17 VITALS — BP 126/80 | HR 80 | Temp 98.1°F | Wt 222.2 lb

## 2011-01-17 DIAGNOSIS — D72829 Elevated white blood cell count, unspecified: Secondary | ICD-10-CM

## 2011-01-17 DIAGNOSIS — D649 Anemia, unspecified: Secondary | ICD-10-CM | POA: Insufficient documentation

## 2011-01-17 DIAGNOSIS — Z7982 Long term (current) use of aspirin: Secondary | ICD-10-CM

## 2011-01-17 NOTE — Patient Instructions (Signed)
[facility]  Discharge Instructions  RECOMMENDATIONS MADE BY THE CONSULTANT AND ANY TEST RESULTS WILL BE SENT TO YOUR REFERRING DOCTOR.  SPECIAL INSTRUCTIONS/FOLLOW-UP: Lab work needed in one year and Return to Clinic in one year.     I acknowledge that I have been informed and understand all the instructions given to me and received a copy. I do not have any more questions at this time, but understand that I may call the Specialty Clinic at [facility] at [phone] during business hours should I have any further questions or need assistance in obtaining follow-up care.    __________________________________________  _____________  __________ Signature of Patient or Authorized Representative            Date                   Time    __________________________________________ Nurse's Signature

## 2011-01-17 NOTE — Progress Notes (Signed)
This office note has been dictated.

## 2011-01-17 NOTE — Progress Notes (Signed)
CC:   Lionel December, M.D. Madelin Rear. Fusco, MD  DIAGNOSES: 1. Mild leukocytosis secondary to a splenectomy many years ago, and he     also has a reactive thrombocytosis as well. 2. Iron deficiency anemia due to gastrointestinal bleed with an ulcer     documented July 2011, presented with a ferritin of 13 and he is now     up to 98 on iron 3 times a week, and will come down to once a week     presently. 3. Use of aspirin and Plavix in the past, but still on aspirin due to     heart disease with a myocardial infarction in 2009. 4. Myocardial infarction 2009. 5. Hypercholesterolemia on Crestor. 6. Morphine induced hallucinations. 7. Cholecystectomy in 2006. 8. Allergic sinusitis times many years. 9. History of pseudomyxoma peritonei status post abdominal surgery     with "shake and bake" by Dr. Esmond Harps at [facility] in the 1990s     thus far without recurrence with a negative CT of the abdomen and     pelvis July 2011.  Arnaldo' blood work is absolutely excellent.  His hemoglobin is staying at the 16 g range.  White count still minimally elevated at 11,400 with an unremarkable differential.  Absolute lymphocytes are barely high at 5400.  His platelets are 468,000.  His ferritin has gone from 51 in July to 98 now, so I have asked him to just let his iron be once a week.  He will comply with that.  Will just see him in a year and will go from there.    ______________________________ Ladona Horns. Mariel Sleet, MD ESN/MEDQ  D:  01/17/2011  T:  01/17/2011  Job:  [id number]

## 2011-11-02 ENCOUNTER — Encounter: Payer: Self-pay | Admitting: Cardiology

## 2012-01-02 ENCOUNTER — Other Ambulatory Visit (HOSPITAL_COMMUNITY): Payer: BC Managed Care – PPO

## 2012-01-04 ENCOUNTER — Ambulatory Visit (HOSPITAL_COMMUNITY): Payer: BC Managed Care – PPO | Admitting: Oncology

## 2012-03-19 ENCOUNTER — Other Ambulatory Visit: Payer: Self-pay | Admitting: Cardiology

## 2012-04-09 ENCOUNTER — Encounter: Payer: Self-pay | Admitting: Cardiology

## 2012-04-09 ENCOUNTER — Ambulatory Visit (INDEPENDENT_AMBULATORY_CARE_PROVIDER_SITE_OTHER): Payer: BC Managed Care – PPO | Admitting: Cardiology

## 2012-04-09 VITALS — BP 110/80 | Ht 73.0 in | Wt 218.0 lb

## 2012-04-09 DIAGNOSIS — E785 Hyperlipidemia, unspecified: Secondary | ICD-10-CM

## 2012-04-09 DIAGNOSIS — I2510 Atherosclerotic heart disease of native coronary artery without angina pectoris: Secondary | ICD-10-CM

## 2012-04-09 MED ORDER — ROSUVASTATIN CALCIUM 40 MG PO TABS: 40.0000 mg | ORAL_TABLET | Freq: Every day | ORAL | Status: DC

## 2012-04-09 NOTE — Addendum Note (Signed)
Addended by: Derry Lory A on: 04/09/2012 03:39 PM   Modules accepted: Orders

## 2012-04-09 NOTE — Patient Instructions (Addendum)
Your physician recommends that you schedule a follow-up appointment in: ONE YEAR 

## 2012-04-09 NOTE — Assessment & Plan Note (Signed)
Stable. Continue secondary preventative therapy. Please note the patient cannot take aspirin.

## 2012-04-09 NOTE — Progress Notes (Signed)
HPI Samuel Tanner returns today for evaluation and management of his history of coronary artery disease and stent placement to the mid circumflex in the past. He has normal left ventricular function. Last catheterization was in 2009 which demonstrated a patent stent with a 40% LAD stenosis.  He developed a peptic ulcer on 81 mg of aspirin. That has been discontinued.   He denies any angina or ischemic symptoms. His primary care physician now is Dr. Catalina Pizza. He follows his blood work. His last cholesterol was 150 per his record. Past Medical History  Diagnosis Date  . Hyperlipidemia     mixed  . Chest tightness, discomfort, or pressure   . Appendiceal carcinoid tumor     resection  15 yrs ago    Current Outpatient Prescriptions  Medication Sig Dispense Refill  . CRESTOR 40 MG tablet TAKE 1 TABLET DAILY  90 tablet  1  . fish oil-omega-3 fatty acids 1000 MG capsule Take 2 g by mouth daily.       No current facility-administered medications for this visit.    Allergies  Allergen Reactions  . Asa (Aspirin)     PT HAS PEPTIC ULCER  . Morphine And Related     hallucinations    Family History  Problem Relation Age of Onset  . Heart attack Father   . Cancer Father     prostate cancer  . Heart disease Father     History   Social History  . Marital Status: Married    Spouse Name: N/A    Number of Children: N/A  . Years of Education: N/A   Occupational History  . part time     pharmacist   Social History Main Topics  . Smoking status: Never Smoker   . Smokeless tobacco: Never Used  . Alcohol Use: No  . Drug Use: No  . Sexually Active: Not on file   Other Topics Concern  . Not on file   Social History Narrative  . No narrative on file    ROS ALL NEGATIVE EXCEPT THOSE NOTED IN HPI  PE  General Appearance: well developed, well nourished in no acute distress HEENT: symmetrical face, PERRLA, good dentition  Neck: no JVD, thyromegaly, or adenopathy, trachea  midline Chest: symmetric without deformity Cardiac: PMI non-displaced, RRR, normal S1, S2, no gallop, very soft systolic murmur left upper sternal border. No diastolic component. Lung: clear to ausculation and percussion Vascular: all pulses full without bruits  Abdominal: nondistended, nontender, good bowel sounds, no HSM, no bruits Extremities: no cyanosis, clubbing or edema, no sign of DVT, no varicosities  Skin: normal color, no rashes Neuro: alert and oriented x 3, non-focal Pysch: normal affect  EKG Normal sinus rhythm with PVCs.  BMET    Component Value Date/Time   NA 138 03/20/2010 1000   K 4.8 03/20/2010 1000   CL 104 03/20/2010 1000   CO2 26 03/20/2010 1000   GLUCOSE 90 03/20/2010 1000   BUN 18 03/20/2010 1000   CREATININE 1.11 03/20/2010 1000   CALCIUM 10.0 03/20/2010 1000   GFRNONAA >60 03/20/2010 1000   GFRAA  Value: >60        The eGFR has been calculated using the MDRD equation. This calculation has not been validated in all clinical situations. eGFR's persistently <60 mL/min signify possible Chronic Kidney Disease. 03/20/2010 1000    Lipid Panel     Component Value Date/Time   CHOL  Value: 109        ATP  III CLASSIFICATION:  <200     mg/dL   Desirable  161-096  mg/dL   Borderline High  >=045    mg/dL   High        04/23/8117 0458   TRIG 226* 08/02/2009 0458   HDL 22* 08/02/2009 0458   CHOLHDL 5.0 08/02/2009 0458   VLDL 45* 08/02/2009 0458   LDLCALC  Value: 42        Total Cholesterol/HDL:CHD Risk Coronary Heart Disease Risk Table                     Men   Women  1/2 Average Risk   3.4   3.3  Average Risk       5.0   4.4  2 X Average Risk   9.6   7.1  3 X Average Risk  23.4   11.0        Use the calculated Patient Ratio above and the CHD Risk Table to determine the patient's CHD Risk.        ATP III CLASSIFICATION (LDL):  <100     mg/dL   Optimal  147-829  mg/dL   Near or Above                    Optimal  130-159  mg/dL   Borderline  562-130  mg/dL   High  >865     mg/dL   Very High  7/84/6962 0458    CBC    Component Value Date/Time   WBC 11.4* 01/15/2011 0912   RBC 5.16 01/15/2011 0912   HGB 16.4 01/15/2011 0912   HCT 48.2 01/15/2011 0912   PLT 468* 01/15/2011 0912   MCV 93.4 01/15/2011 0912   MCH 31.8 01/15/2011 0912   MCHC 34.0 01/15/2011 0912   RDW 12.9 01/15/2011 0912   LYMPHSABS 5.4* 01/15/2011 0912   MONOABS 1.2* 01/15/2011 0912   EOSABS 0.5 01/15/2011 0912   BASOSABS 0.1 01/15/2011 0912

## 2013-03-28 ENCOUNTER — Encounter (HOSPITAL_COMMUNITY): Payer: Self-pay | Admitting: Emergency Medicine

## 2013-03-28 ENCOUNTER — Inpatient Hospital Stay (HOSPITAL_COMMUNITY)
Admission: EM | Admit: 2013-03-28 | Discharge: 2013-03-30 | DRG: 378 | Disposition: A | Discharge status: Home / Self Care | Payer: Medicare Other | Attending: Internal Medicine | Admitting: Internal Medicine

## 2013-03-28 DIAGNOSIS — K921 Melena: Secondary | ICD-10-CM

## 2013-03-28 DIAGNOSIS — D62 Acute posthemorrhagic anemia: Secondary | ICD-10-CM | POA: Diagnosis present

## 2013-03-28 DIAGNOSIS — K922 Gastrointestinal hemorrhage, unspecified: Secondary | ICD-10-CM

## 2013-03-28 DIAGNOSIS — K254 Chronic or unspecified gastric ulcer with hemorrhage: Principal | ICD-10-CM | POA: Diagnosis present

## 2013-03-28 DIAGNOSIS — K222 Esophageal obstruction: Secondary | ICD-10-CM | POA: Diagnosis present

## 2013-03-28 DIAGNOSIS — D72829 Elevated white blood cell count, unspecified: Secondary | ICD-10-CM | POA: Diagnosis present

## 2013-03-28 DIAGNOSIS — E782 Mixed hyperlipidemia: Secondary | ICD-10-CM | POA: Diagnosis present

## 2013-03-28 DIAGNOSIS — K449 Diaphragmatic hernia without obstruction or gangrene: Secondary | ICD-10-CM | POA: Diagnosis present

## 2013-03-28 DIAGNOSIS — E785 Hyperlipidemia, unspecified: Secondary | ICD-10-CM

## 2013-03-28 DIAGNOSIS — K2270 Barrett's esophagus without dysplasia: Secondary | ICD-10-CM | POA: Diagnosis present

## 2013-03-28 DIAGNOSIS — T3995XA Adverse effect of unspecified nonopioid analgesic, antipyretic and antirheumatic, initial encounter: Secondary | ICD-10-CM | POA: Diagnosis present

## 2013-03-28 HISTORY — DX: Acute myocardial infarction, unspecified: I21.9

## 2013-03-28 HISTORY — DX: Reserved for concepts with insufficient information to code with codable children: IMO0002

## 2013-03-28 LAB — CBC
HCT: 38.2 % — ABNORMAL LOW (ref 39.0–52.0)
Hemoglobin: 13 g/dL (ref 13.0–17.0)
MCH: 32.1 pg (ref 26.0–34.0)
MCHC: 34 g/dL (ref 30.0–36.0)
MCV: 94.3 fL (ref 78.0–100.0)
PLATELETS: 393 10*3/uL (ref 150–400)
RBC: 4.05 MIL/uL — ABNORMAL LOW (ref 4.22–5.81)
RDW: 12.6 % (ref 11.5–15.5)
WBC: 16.3 10*3/uL — ABNORMAL HIGH (ref 4.0–10.5)

## 2013-03-28 LAB — CBC WITH DIFFERENTIAL/PLATELET
BASOS PCT: 0 % (ref 0–1)
Basophils Absolute: 0 10*3/uL (ref 0.0–0.1)
EOS ABS: 0.2 10*3/uL (ref 0.0–0.7)
Eosinophils Relative: 1 % (ref 0–5)
HCT: 40.9 % (ref 39.0–52.0)
Hemoglobin: 14.1 g/dL (ref 13.0–17.0)
LYMPHS ABS: 3.6 10*3/uL (ref 0.7–4.0)
Lymphocytes Relative: 24 % (ref 12–46)
MCH: 32.5 pg (ref 26.0–34.0)
MCHC: 34.5 g/dL (ref 30.0–36.0)
MCV: 94.2 fL (ref 78.0–100.0)
MONO ABS: 1.7 10*3/uL — AB (ref 0.1–1.0)
Monocytes Relative: 11 % (ref 3–12)
NEUTROS PCT: 64 % (ref 43–77)
Neutro Abs: 9.7 10*3/uL — ABNORMAL HIGH (ref 1.7–7.7)
PLATELETS: 388 10*3/uL (ref 150–400)
RBC: 4.34 MIL/uL (ref 4.22–5.81)
RDW: 12.5 % (ref 11.5–15.5)
WBC: 15.2 10*3/uL — ABNORMAL HIGH (ref 4.0–10.5)

## 2013-03-28 LAB — LIPASE, BLOOD: Lipase: 22 U/L (ref 11–59)

## 2013-03-28 LAB — COMPREHENSIVE METABOLIC PANEL
ALK PHOS: 61 U/L (ref 39–117)
ALT: 22 U/L (ref 0–53)
AST: 20 U/L (ref 0–37)
Albumin: 3.5 g/dL (ref 3.5–5.2)
BILIRUBIN TOTAL: 0.8 mg/dL (ref 0.3–1.2)
BUN: 44 mg/dL — AB (ref 6–23)
CO2: 24 mEq/L (ref 19–32)
Calcium: 8.8 mg/dL (ref 8.4–10.5)
Chloride: 105 mEq/L (ref 96–112)
Creatinine, Ser: 1.02 mg/dL (ref 0.50–1.35)
GFR calc Af Amer: 87 mL/min — ABNORMAL LOW (ref >90)
GFR calc non Af Amer: 75 mL/min — ABNORMAL LOW (ref >90)
GLUCOSE: 122 mg/dL — AB (ref 70–99)
POTASSIUM: 4 meq/L (ref 3.7–5.3)
Sodium: 140 mEq/L (ref 137–147)
TOTAL PROTEIN: 7 g/dL (ref 6.0–8.3)

## 2013-03-28 LAB — TYPE AND SCREEN
ABO/RH(D): O POS
ANTIBODY SCREEN: NEGATIVE

## 2013-03-28 LAB — PROTIME-INR
INR: 1.08 (ref 0.00–1.49)
PROTHROMBIN TIME: 13.8 s (ref 11.6–15.2)

## 2013-03-28 MED ORDER — ONDANSETRON HCL 4 MG/2ML IJ SOLN
4.0000 mg | Freq: Once | INTRAMUSCULAR | Status: AC
  Administered 2013-03-28: 4 mg via INTRAVENOUS
  Filled 2013-03-28: qty 2

## 2013-03-28 MED ORDER — ATORVASTATIN CALCIUM 40 MG PO TABS: 80.0000 mg | ORAL_TABLET | Freq: Every day | ORAL | Status: DC

## 2013-03-28 MED ORDER — SODIUM CHLORIDE 0.9 % IJ SOLN: 3.0000 mL | Freq: Two times a day (BID) | INTRAMUSCULAR | Status: DC

## 2013-03-28 MED ORDER — SODIUM CHLORIDE 0.9 % IV SOLN: INTRAVENOUS | Status: DC

## 2013-03-28 MED ORDER — SODIUM CHLORIDE 0.9 % IV SOLN: 1000.0000 mL | INTRAVENOUS | Status: DC

## 2013-03-28 MED ORDER — ONDANSETRON HCL 4 MG PO TABS: 4.0000 mg | ORAL_TABLET | Freq: Four times a day (QID) | ORAL | Status: DC | PRN

## 2013-03-28 MED ORDER — ACETAMINOPHEN 650 MG RE SUPP: 650.0000 mg | Freq: Four times a day (QID) | RECTAL | Status: DC | PRN

## 2013-03-28 MED ORDER — PANTOPRAZOLE SODIUM 40 MG IV SOLR
INTRAVENOUS | Status: AC
  Filled 2013-03-28: qty 80

## 2013-03-28 MED ORDER — ACETAMINOPHEN 325 MG PO TABS: 650.0000 mg | ORAL_TABLET | Freq: Four times a day (QID) | ORAL | Status: DC | PRN

## 2013-03-28 MED ORDER — SODIUM CHLORIDE 0.9 % IV BOLUS (SEPSIS)
1000.0000 mL | Freq: Once | INTRAVENOUS | Status: AC
  Administered 2013-03-28: 1000 mL via INTRAVENOUS

## 2013-03-28 MED ORDER — DIPHENHYDRAMINE HCL 50 MG/ML IJ SOLN
12.5000 mg | Freq: Once | INTRAMUSCULAR | Status: AC
  Administered 2013-03-29: 12.5 mg via INTRAVENOUS
  Filled 2013-03-28: qty 1

## 2013-03-28 MED ORDER — SODIUM CHLORIDE 0.9 % IV SOLN
8.0000 mg/h | INTRAVENOUS | Status: DC
  Administered 2013-03-28 – 2013-03-30 (×3): 8 mg/h via INTRAVENOUS
  Filled 2013-03-28 (×7): qty 80

## 2013-03-28 MED ORDER — ONDANSETRON HCL 4 MG/2ML IJ SOLN: 4.0000 mg | Freq: Four times a day (QID) | INTRAMUSCULAR | Status: DC | PRN

## 2013-03-28 MED ORDER — SODIUM CHLORIDE 0.9 % IV SOLN
1000.0000 mL | Freq: Once | INTRAVENOUS | Status: AC
  Administered 2013-03-28: 1000 mL via INTRAVENOUS

## 2013-03-28 MED ORDER — PANTOPRAZOLE SODIUM 40 MG IV SOLR: 40.0000 mg | Freq: Once | INTRAVENOUS | Status: DC

## 2013-03-28 MED ORDER — PANTOPRAZOLE SODIUM 40 MG IV SOLR
40.0000 mg | Freq: Once | INTRAVENOUS | Status: AC
  Administered 2013-03-28: 40 mg via INTRAVENOUS
  Filled 2013-03-28: qty 40

## 2013-03-28 NOTE — Consult Note (Signed)
Referring Provider: No ref. provider found Primary Care Physician:  Glo Herring., MD Primary Gastroenterologist:  Dr. 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 Had a brown bowel movement per se in the morning and then had a black tarry stool around noon. He was brought to the ED where he was evaluated by Dr. Wyvonnia Dusky. He was found to be hemodynamically stable. Black stool on digital rectal examination. No hematochezia. No hematemesis. Initial hemoglobin 14. He has been admitted for further evaluation. Patient readily admits to taking regular Alka-Seltzer, over-the-counter headache remedies with aspirin and Aleve for various aches and pains and a cold recently. Patient states he has occasional GERD but otherwise is devoid of any GI tract symptoms.  Patient suffered an upper GI bleed secondary to aspirin Plavix and 2011. Multiple gastric ulcers requiring therapeutic intervention by Dr. Oneida Alar at EGD. Also had a suspicious ulcerated duodenal lesion for which he was referred to [facility]. Apparently had an EUS and this was felt to be a benign lesion. GI history also significant for appendiceal carcinoid and pseudotumor peritni for which he underwent surgery here and at [facility]. He had intraperitoneal chemotherapy. He was reexplored at the time of open cholecystectomy a few years ago and had no evidence of recurrent neoplasia.  Patient also has a history of Clostridium difficile infection in the distant past when exposed to antibiotics for sinusitis ED records mentions a history of "gallbladder cancer". Patient denies any history of gallbladder cancer. Colonoscopy for screening per patient report-[facility]. [facility] 3-4 years .  Past Medical History  Diagnosis Date  . Hyperlipidemia     mixed  . Chest tightness, discomfort, or pressure   . Appendiceal carcinoid  tumor     resection  15 yrs ago  . Ulcer   . MI (myocardial infarction)     Past Surgical History  Procedure Laterality Date  .  appendiceal carcinoid       resection 15 yrs sgo  . Cholecystectomy    . Cardiac catheterization      06/27/2007 and 09/17/2001    Prior to Admission medications   Medication Sig Start Date End Date Taking? Authorizing Provider  Chlorphen-Phenyleph-ASA (ALKA-SELTZER PLUS COLD PO) Take 2 capsules by mouth every 6 (six) hours as needed. cold   Yes Historical Provider, MD  fish oil-omega-3 fatty acids 1000 MG capsule Take 1 g by mouth daily.    Yes Historical Provider, MD  rosuvastatin (CRESTOR) 40 MG tablet Take 1 tablet (40 mg total) by mouth daily. 04/09/12  Yes Renella Cunas, MD    Current Facility-Administered Medications  Medication Dose Route Frequency Provider Last Rate Last Dose  . 0.9 %  sodium chloride infusion  1,000 mL Intravenous Continuous Ezequiel Essex, MD      . 0.9 %  sodium chloride infusion   Intravenous STAT Ezequiel Essex, MD      . pantoprazole (PROTONIX) 80 mg in sodium chloride 0.9 % 250 mL infusion  8 mg/hr Intravenous Continuous Daneil Dolin, MD      . pantoprazole (PROTONIX) injection 40 mg  40 mg Intravenous Once Cristopher Estimable Rourk, MD      . sodium chloride 0.9 % bolus 1,000 mL  1,000 mL Intravenous Once Ezequiel Essex, MD 500 mL/hr at 03/28/13 1554 1,000 mL at 03/28/13 1554    Allergies as of 03/28/2013 - Review Complete 03/28/2013  Allergen Reaction Noted  .  Asa [aspirin]  04/09/2012  . Morphine and related  01/17/2011    Family History  Problem Relation Age of Onset  . Heart attack Father   . Cancer Father     prostate cancer  . Heart disease Father     History   Social History  . Marital Status: Married    Spouse Name: N/A    Number of Children: N/A  . Years of Education: N/A   Occupational History  . part time     pharmacist   Social History Main Topics  . Smoking status: Never Smoker   . Smokeless  tobacco: Never Used  . Alcohol Use: No  . Drug Use: No  . Sexual Activity: Not on file   Other Topics Concern  . Not on file   Social History Narrative  . No narrative on file    Review of Systems: Gen: Denies any fever, chills, anorexia, fatigue, CV: Denies chest pain, angina, palpitations, syncope, orthopnea, PND, peripheral edema, and claudication. Resp: Denies dyspnea at rest, dyspnea with exercise, cough, sputum, wheezing, coughing up blood, and pleurisy. GI: Denies vomiting blood, jaundice, and fecal incontinence.   Denies dysphagia or odynophagia. Derm: Denies rash, itching, dry skin, hives, moles, warts, or unhealing ulcers.  Psych: Denies depression, anxiety, memory loss, suicidal ideation, hallucinations, paranoia, and confusion. Heme: Denies bruising, bleeding, and enlarged lymph nodes.   Physical Exam: Vital signs in last 24 hours: Temp:  [98.1 F (36.7 C)-98.8 F (37.1 C)] 98.1 F (36.7 C) (03/14 1645) Pulse Rate:  [67-98] 70 (03/14 1645) Resp:  [20] 20 (03/14 1645) BP: (92-128)/(65-80) 128/77 mmHg (03/14 1645) SpO2:  [97 %-100 %] 97 % (03/14 1645) Weight:  [218 lb (98.884 kg)-221 lb 5.5 oz (100.4 kg)] 221 lb 5.5 oz (100.4 kg) (03/14 1645)   General:   Alert,  Well-developed, well-nourished, pleasant and cooperative in NAD. Accompanied by wife and pastor. Head:  Normocephalic and atraumatic. Eyes:  Sclera clear, no icterus.   Conjunctiva pink. Ears:  Normal auditory acuity. Nose:  No deformity, discharge,  or lesions. Mouth:  No deformity or lesions, dentition normal. Neck:  Supple; no masses or thyromegaly. Lungs:  Clear throughout to auscultation.   No wheezes, crackles, or rhonchi. No acute distress. Heart:  Regular rate and rhythm; no murmurs, clicks, rubs,  or gallops. Abdomen:  Nondistended. Well-healed midline surgical scar present. Positive bowel sounds. Entirely soft and nontender without appreciable mass or organomegaly Rectal:  Already done in ED.    Neurologic:  Alert and  oriented x4;  grossly normal neurologically. Skin:  Intact without significant lesions or rashes. Cervical Nodes:  No significant cervical adenopathy. Psych:  Alert and cooperative. Normal mood and affect.  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 He is hemodynamically stable. Admitting hemoglobin is good but may well drop with equilibration. He has had both NSAID exposure in the way of naproxen and aspirin recently. He has a history of complicated NSAID-induced peptic ulcer disease previously. Gastric mucosal biopsies negative H. pylori in 2011. History of ulcerated lesion in the duodenum which was investigated further at [facility] and felt to be benign. Further information on that episode not available. I suspect he has developed recurrent peptic ulcer disease.  By his report, he is up-to-date on colorectal cancer screening via colonoscopy done by Dr. Laural Golden.  Distant history of pseudotumor peritoni  and appendiceal carcinoid-not likely an issue now.   Recommendations:   IV fluid support/resuscitation. Follow H&H closely. Full 80 mg load pantoprazole followed by infusion 8 mg per hour. Plan for EGD tomorrow morning.The uprisks, benefits, limitations, alternatives and imponderables have been reviewed with the  patient. Potential for esophageal dilation, biopsy, etc. have also been reviewed.  Questions have been answered. All parties agreeable.    Further recommendations to follow

## 2013-03-28 NOTE — ED Provider Notes (Signed)
CSN: [id number]     Arrival date & time 03/28/13  1351 History  This chart was scribed for Ezequiel Essex, MD,  by Stacy Gardner, ED Scribe. The patient was seen in room A307/A307-01 and the patient's care was started at 2:21 PM.    First MD Initiated Contact with Patient 03/28/13 1417     Chief Complaint  Patient presents with  . Rectal Bleeding     (Consider location/radiation/quality/duration/timing/severity/associated sxs/prior Treatment) Patient is a 63 y.o. male presenting with hematochezia. The history is provided by the patient and medical records. No language interpreter was used.  Rectal Bleeding Quality:  Black and tarry Timing:  Constant Chronicity:  Recurrent Associated symptoms: no abdominal pain, no dizziness, no light-headedness and no vomiting    HPI Comments: Samuel Tanner is a 62 y.o. male who presents to the Emergency Department complaining of rectal bleeding since this afternoon. Pt mentions his last stool was this AM was brown and this afternoon his stool is dark. Last night pt felt dizzy, weak and had an episode of near syncope while getting out of his chair last night. He denies current light headed ness and dizziness. Denies chest and abdominal pain.    He had abdominal cramps last night but denies having any today. Pt is concern about C-diff. He had an ulcer two years ago and had to cauterized it. Pt had a colonoscopy and an endoscopy. Pt had carcinoid tumor and gallbladder CA a few years ago. He did not try anything for pain.Denies diarrhea. Nothing seems to make his symptoms better.Denies antibiotics and recent travels. Denies the use of tobacco and alcohol.   Past Medical History  Diagnosis Date  . Hyperlipidemia     mixed  . Chest tightness, discomfort, or pressure   . Appendiceal carcinoid tumor     resection  15 yrs ago  . Ulcer   . MI (myocardial infarction)    Past Surgical History  Procedure Laterality Date  .  appendiceal carcinoid        resection 15 yrs sgo  . Cholecystectomy    . Cardiac catheterization      06/27/2007 and 09/17/2001   Family History  Problem Relation Age of Onset  . Heart attack Father   . Cancer Father     prostate cancer  . Heart disease Father    History  Substance Use Topics  . Smoking status: Never Smoker   . Smokeless tobacco: Never Used  . Alcohol Use: No    Review of Systems  Cardiovascular: Negative for chest pain.  Gastrointestinal: Positive for hematochezia and anal bleeding. Negative for nausea, vomiting, abdominal pain and diarrhea.  Musculoskeletal: Negative for back pain.  Neurological: Negative for dizziness, syncope and light-headedness.  Hematological: Does not bruise/bleed easily.  All other systems reviewed and are negative.      Allergies  Asa and Morphine and related  Home Medications   No current outpatient prescriptions on file. BP 132/82  Pulse 80  Temp(Src) 98 F (36.7 C) (Oral)  Resp 20  Ht 6\' 1"  (1.854 m)  Wt 221 lb 5.5 oz (100.4 kg)  BMI 29.21 kg/m2  SpO2 95% Physical Exam  Nursing note and vitals reviewed. Constitutional: He is oriented to person, place, and time. He appears well-developed and well-nourished. No distress.  appears pale  HENT:  Head: Normocephalic and atraumatic.  Mouth/Throat: Oropharynx is clear and moist. No oropharyngeal exudate.  Eyes: EOM are normal. Pupils are equal, round, and reactive to light.  Neck: Normal range of motion. Neck supple. No tracheal deviation present.  Cardiovascular: Normal rate.  Exam reveals no gallop and no friction rub.   No murmur heard. Pulmonary/Chest: Effort normal. No respiratory distress. He has no wheezes. He has no rales.  Abdominal: Soft. He exhibits no distension. There is tenderness. There is no rebound and no guarding.  Well healed scar surgical scar   LLQ tenderness   Genitourinary:  Black stool in rectal exam  Musculoskeletal: Normal range of motion. He exhibits no edema and no  tenderness.  Neurological: He is alert and oriented to person, place, and time. No cranial nerve deficit. He exhibits normal muscle tone. Coordination normal.  Skin: Skin is warm and dry.  Psychiatric: He has a normal mood and affect. His behavior is normal.    ED Course  Procedures (including critical care time) DIAGNOSTIC STUDIES: Oxygen Saturation is 100% on room air, normal by my interpretation.    COORDINATION OF CARE:  2:24 PM Discussed course of care with pt which includes laboratory tests, Zofran, IV, and  Consult with hospitalist and gastroentrerology. Pt understands and agrees. 3:26 PM Discussed course of care with pt which includes being admitted to the the hospital.  Pt understands and agrees.     Labs Review Labs Reviewed  CBC WITH DIFFERENTIAL - Abnormal; Notable for the following:    WBC 15.2 (*)    Neutro Abs 9.7 (*)    Monocytes Absolute 1.7 (*)    All other components within normal limits  COMPREHENSIVE METABOLIC PANEL - Abnormal; Notable for the following:    Glucose, Bld 122 (*)    BUN 44 (*)    GFR calc non Af Amer 75 (*)    GFR calc Af Amer 87 (*)    All other components within normal limits  CBC - Abnormal; Notable for the following:    WBC 16.3 (*)    RBC 4.05 (*)    HCT 38.2 (*)    All other components within normal limits  PROTIME-INR  LIPASE, BLOOD  BASIC METABOLIC PANEL  CBC  CBC  CBC  POC OCCULT BLOOD, ED  TYPE AND SCREEN   Imaging Review No results found.   EKG Interpretation None      MDM   Final diagnoses:  GI bleed  Melena   near-syncope, nausea, generalized weakness with melena since last night. History of bleeding ulcer as well as mucoid tumor that has been resected. Had EUS for evaluation of additional duodenal lesion that was felt to be benign by his report.  Hemoglobin stable at 14. BUN elevated at 44. Patient started on IV fluids, IV PPI, Zofran.  Previous records reviewed. Discussed with Dr. Gala Romney who will consult  on patient.  Patient remains stable in the ED.  Protonix and IVF given. Dr. Roderic Palau to admit.  BP 132/82  Pulse 80  Temp(Src) 98 F (36.7 C) (Oral)  Resp 20  Ht 6\' 1"  (1.854 m)  Wt 221 lb 5.5 oz (100.4 kg)  BMI 29.21 kg/m2  SpO2 95%   I personally performed the services described in this documentation, which was scribed in my presence. The recorded information has been reviewed and is accurate.   Ezequiel Essex, MD 03/28/13 [phone]

## 2013-03-28 NOTE — ED Notes (Signed)
POC occult blood sample obtained by EDP. AT bedside sample tested and resulted positive. EDP aware.

## 2013-03-28 NOTE — ED Notes (Addendum)
While attempting to place IV. Pt became pale,diaphoretic and dizzy. Pt bp 92/65. Orthostatics not completed at this time. IV placed and fluids started. Pt reports feeling better. Pt no longer clammy. Pt color at baseline. Pt bp 105/69.

## 2013-03-28 NOTE — ED Notes (Signed)
Pt complain of dark blood in his stool. Has a history of an ulcer

## 2013-03-28 NOTE — H&P (Signed)
[facility] History and Physical  Samuel Tanner:[id number] DOB: 1950-02-18 DOA: 03/28/2013  Referring physician: Dr. Wyvonnia Dusky PCP: Delphina Cahill, MD   Chief Complaint: dark stools  HPI: Samuel Tanner is a 63 y.o. male presents to the hospital with GI bleeding. Patient reports experiencing diaphoresis, lightheadedness and presyncopal episodes earlier this morning. Around noon the patient had a dark tarry stool. He presents to the hospital for evaluation where he's had 2 dark colored stools since then. Initial hemoglobin is having normal at 14. The patient reports that he rarely uses Alka-Seltzer, aspirin and Aleve. He's not had any vomiting. Denies any abdominal pain at this time. He was started on Protonix in the emergency room. Gastrology seen and started on a Protonix infusion. He'll be admitted for further evaluation.    Review of Systems:  Pertinent positives as per history of present illness, otherwise negative.   Past Medical History  Diagnosis Date  . Hyperlipidemia     mixed  . Chest tightness, discomfort, or pressure   . Appendiceal carcinoid tumor     resection  15 yrs ago  . Ulcer   . MI (myocardial infarction)    Past Surgical History  Procedure Laterality Date  .  appendiceal carcinoid       resection 15 yrs sgo  . Cholecystectomy    . Cardiac catheterization      06/27/2007 and 09/17/2001   Social History:  reports that he has never smoked. He has never used smokeless tobacco. He reports that he does not drink alcohol or use illicit drugs.  Allergies  Allergen Reactions  . Asa [Aspirin]     PT HAS PEPTIC ULCER  . Morphine And Related     hallucinations    Family History  Problem Relation Age of Onset  . Heart attack Father   . Cancer Father     prostate cancer  . Heart disease Father      Prior to Admission medications   Medication Sig Start Date End Date Taking? Authorizing Provider  Chlorphen-Phenyleph-ASA (ALKA-SELTZER PLUS COLD PO) Take 2  capsules by mouth every 6 (six) hours as needed. cold   Yes Historical Provider, MD  fish oil-omega-3 fatty acids 1000 MG capsule Take 1 g by mouth daily.    Yes Historical Provider, MD  rosuvastatin (CRESTOR) 40 MG tablet Take 1 tablet (40 mg total) by mouth daily. 04/09/12  Yes Renella Cunas, MD   Physical Exam: Filed Vitals:   03/28/13 1645  BP: 128/77  Pulse: 70  Temp: 98.1 F (36.7 C)  Resp: 20    BP 128/77  Pulse 70  Temp(Src) 98.1 F (36.7 C) (Oral)  Resp 20  Ht 6\' 1"  (1.854 m)  Wt 100.4 kg (221 lb 5.5 oz)  BMI 29.21 kg/m2  SpO2 97%  General:  Appears calm and comfortable Eyes: PERRL, normal lids, irises & conjunctiva ENT: grossly normal hearing, lips & tongue Neck: no LAD, masses or thyromegaly Cardiovascular: RRR, no m/r/g. No LE edema. Telemetry: SR, no arrhythmias  Respiratory: CTA bilaterally, no w/r/r. Normal respiratory effort. Abdomen: soft, ntnd Skin: no rash or induration seen on limited exam Musculoskeletal: grossly normal tone BUE/BLE Psychiatric: grossly normal mood and affect, speech fluent and appropriate Neurologic: grossly non-focal.          Labs on Admission:  Basic Metabolic Panel:  Recent Labs Lab 03/28/13 1430  NA 140  K 4.0  CL 105  CO2 24  GLUCOSE 122*  BUN 44*  CREATININE  1.02  CALCIUM 8.8   Liver Function Tests:  Recent Labs Lab 03/28/13 1430  AST 20  ALT 22  ALKPHOS 61  BILITOT 0.8  PROT 7.0  ALBUMIN 3.5    Recent Labs Lab 03/28/13 1430  LIPASE 22   No results found for this basename: AMMONIA,  in the last 168 hours CBC:  Recent Labs Lab 03/28/13 1430  WBC 15.2*  NEUTROABS 9.7*  HGB 14.1  HCT 40.9  MCV 94.2  PLT 388   Cardiac Enzymes: No results found for this basename: CKTOTAL, CKMB, CKMBINDEX, TROPONINI,  in the last 168 hours  BNP (last 3 results) No results found for this basename: PROBNP,  in the last 8760 hours CBG: No results found for this basename: GLUCAP,  in the last 168  hours  Radiological Exams on Admission: No results found.  Assessment/Plan Active Problems:   HYPERLIPIDEMIA-MIXED   GI bleed   1. GI bleed, likely upper GI bleed. Patient has been seen by gastroenterology and will undergo EGD in the morning. He'll be kept n.p.o. after midnight. He is continued on Protonix infusion. We'll continue with IV fluid resuscitation. Monitor hemoglobin every 8 hours. Transfuse as needed. He is not on any anticoagulants. 2. Hyperlipidemia. Continue statin.  Code Status: full code Family Communication: discussed with patient and wife at the bedside Disposition Plan: discharge home once improved  Time spent: 41mins  Aymee Fomby [facility] Pager [phone]

## 2013-03-29 ENCOUNTER — Encounter (HOSPITAL_COMMUNITY)
Admission: EM | Disposition: A | Discharge status: Home / Self Care | Payer: Self-pay | Source: Home / Self Care | Attending: Internal Medicine

## 2013-03-29 ENCOUNTER — Encounter (HOSPITAL_COMMUNITY): Payer: Self-pay | Admitting: *Deleted

## 2013-03-29 DIAGNOSIS — D72829 Elevated white blood cell count, unspecified: Secondary | ICD-10-CM | POA: Diagnosis present

## 2013-03-29 DIAGNOSIS — K2270 Barrett's esophagus without dysplasia: Secondary | ICD-10-CM

## 2013-03-29 DIAGNOSIS — K259 Gastric ulcer, unspecified as acute or chronic, without hemorrhage or perforation: Secondary | ICD-10-CM

## 2013-03-29 DIAGNOSIS — D62 Acute posthemorrhagic anemia: Secondary | ICD-10-CM | POA: Diagnosis present

## 2013-03-29 HISTORY — PX: ESOPHAGOGASTRODUODENOSCOPY: SHX5428

## 2013-03-29 HISTORY — DX: Barrett's esophagus without dysplasia: K22.70

## 2013-03-29 LAB — CBC
HCT: 35.5 % — ABNORMAL LOW (ref 39.0–52.0)
HCT: 34.5 % — ABNORMAL LOW (ref 39.0–52.0)
HEMATOCRIT: 37.2 % — AB (ref 39.0–52.0)
HEMOGLOBIN: 12.7 g/dL — AB (ref 13.0–17.0)
HEMOGLOBIN: 11.7 g/dL — AB (ref 13.0–17.0)
Hemoglobin: 12 g/dL — ABNORMAL LOW (ref 13.0–17.0)
MCH: 32.2 pg (ref 26.0–34.0)
MCH: 32.2 pg (ref 26.0–34.0)
MCH: 32.2 pg (ref 26.0–34.0)
MCHC: 34.1 g/dL (ref 30.0–36.0)
MCHC: 33.8 g/dL (ref 30.0–36.0)
MCHC: 33.9 g/dL (ref 30.0–36.0)
MCV: 94.2 fL (ref 78.0–100.0)
MCV: 95.2 fL (ref 78.0–100.0)
MCV: 95 fL (ref 78.0–100.0)
PLATELETS: 360 10*3/uL (ref 150–400)
Platelets: 395 10*3/uL (ref 150–400)
Platelets: 358 10*3/uL (ref 150–400)
RBC: 3.95 MIL/uL — ABNORMAL LOW (ref 4.22–5.81)
RBC: 3.73 MIL/uL — ABNORMAL LOW (ref 4.22–5.81)
RBC: 3.63 MIL/uL — AB (ref 4.22–5.81)
RDW: 12.7 % (ref 11.5–15.5)
RDW: 12.9 % (ref 11.5–15.5)
RDW: 12.8 % (ref 11.5–15.5)
WBC: 20.2 10*3/uL — ABNORMAL HIGH (ref 4.0–10.5)
WBC: 13.8 10*3/uL — AB (ref 4.0–10.5)
WBC: 13.8 10*3/uL — ABNORMAL HIGH (ref 4.0–10.5)

## 2013-03-29 LAB — BASIC METABOLIC PANEL
BUN: 36 mg/dL — AB (ref 6–23)
CO2: 25 mEq/L (ref 19–32)
CREATININE: 1.15 mg/dL (ref 0.50–1.35)
Calcium: 8.7 mg/dL (ref 8.4–10.5)
Chloride: 109 mEq/L (ref 96–112)
GFR calc Af Amer: 75 mL/min — ABNORMAL LOW (ref >90)
GFR calc non Af Amer: 65 mL/min — ABNORMAL LOW (ref >90)
GLUCOSE: 111 mg/dL — AB (ref 70–99)
Potassium: 4.4 mEq/L (ref 3.7–5.3)
Sodium: 144 mEq/L (ref 137–147)

## 2013-03-29 LAB — SURGICAL PCR SCREEN
MRSA, PCR: NEGATIVE
STAPHYLOCOCCUS AUREUS: NEGATIVE

## 2013-03-29 SURGERY — EGD (ESOPHAGOGASTRODUODENOSCOPY)
Anesthesia: Moderate Sedation

## 2013-03-29 MED ORDER — MIDAZOLAM HCL 5 MG/5ML IJ SOLN
INTRAMUSCULAR | Status: DC | PRN
  Administered 2013-03-29: 1 mg via INTRAVENOUS
  Administered 2013-03-29: 2 mg via INTRAVENOUS

## 2013-03-29 MED ORDER — ONDANSETRON HCL 4 MG/2ML IJ SOLN
INTRAMUSCULAR | Status: AC
  Filled 2013-03-29: qty 2

## 2013-03-29 MED ORDER — EPINEPHRINE HCL 0.1 MG/ML IJ SOSY
PREFILLED_SYRINGE | INTRAMUSCULAR | Status: AC
  Filled 2013-03-29: qty 10

## 2013-03-29 MED ORDER — MEPERIDINE HCL 100 MG/ML IJ SOLN
INTRAMUSCULAR | Status: DC | PRN
  Administered 2013-03-29: 50 mg via INTRAVENOUS

## 2013-03-29 MED ORDER — STERILE WATER FOR IRRIGATION IR SOLN
Status: DC | PRN
  Administered 2013-03-29: 09:00:00

## 2013-03-29 MED ORDER — MIDAZOLAM HCL 5 MG/5ML IJ SOLN
INTRAMUSCULAR | Status: AC
  Filled 2013-03-29: qty 10

## 2013-03-29 MED ORDER — LIDOCAINE VISCOUS 2 % MT SOLN
OROMUCOSAL | Status: DC | PRN
  Administered 2013-03-29 (×2): 2 mL via OROMUCOSAL

## 2013-03-29 MED ORDER — SODIUM CHLORIDE 0.9 % IJ SOLN
PREFILLED_SYRINGE | INTRAMUSCULAR | Status: DC | PRN
  Administered 2013-03-29: 09:00:00

## 2013-03-29 MED ORDER — MEPERIDINE HCL 100 MG/ML IJ SOLN
INTRAMUSCULAR | Status: AC
  Filled 2013-03-29: qty 2

## 2013-03-29 MED ORDER — LIDOCAINE VISCOUS 2 % MT SOLN
OROMUCOSAL | Status: AC
  Filled 2013-03-29: qty 15

## 2013-03-29 NOTE — Progress Notes (Signed)
Late Entry for this morning   Dr. Roderic Palau was notified of patients WBC result this am.

## 2013-03-29 NOTE — Op Note (Signed)
[facility] [address] [place], [address]   ENDOSCOPY PROCEDURE REPORT  PATIENT: Samuel Tanner, Samuel Tanner  MR#: [id number] BIRTHDATE: 17-Feb-1950 , 63  yrs. old GENDER: Male ENDOSCOPIST: R.  Garfield Cornea, MD FACP FACG REFERRED BY:  Delphina Cahill, M.D. PROCEDURE DATE:  03/29/2013 PROCEDURE:     EGD with biopsy and bleeding control therapy  INDICATIONS:    melena;  decline in Hemoglobin; patient noted to have a white count 20,000 this morning (15,000 yesterday). hemoglobin down 2 g to 12 .  Patient has not had any associated fever, chills or any abdominal pain, whatsoever. His abdominal exam is entirely benign in short stay prior to initiation of the procedure.  INFORMED CONSENT:   The risks, benefits, limitations, alternatives and imponderables have been discussed.  The potential for biopsy, esophogeal dilation, etc. have also been reviewed.  Questions have been answered.  All parties agreeable.  Please see the history and physical in the medical record for more information.  MEDICATIONS:     Versed 3 mg IV and Demerol 50 mg IV in divided doses. Zofran 4 mg IV. Xylocaine gel orally  DESCRIPTION OF PROCEDURE:   The HC-6237S ([id number])  endoscope was introduced through the mouth and advanced to the second portion of the duodenum without difficulty or limitations.  The mucosal surfaces were surveyed very carefully during advancement of the scope and upon withdrawal.  Retroflexion view of the proximal stomach and esophagogastric junction was performed.      FINDINGS:   2.5 cm "tongue" of salmon-colored epithelium coming up from the GE junction. Noncritical Schatzki's ring.  Stomach empty. patient had a 1.25 cm stellate shaped ulcer straddling the junction point cardia and fundus. It had a raised vascular protuberance consistent with a visible vessel. Patient had satellite erosions and ulcerations in the fundus and cardia. Patient had intense patchy erythema and  superficial erosions of the antrum. I did not see an obvious infiltrating process. Patent pylorus. In the bulb, the patient a multilobulated 3-4 cm submucosal mass at the junction of the first and second portion of the duodenum. Please see above photos. Otherwise, the remainder of the bulb and second portion appeared normal.  THERAPEUTIC / DIAGNOSTIC MANEUVERS PERFORMED:  The margin of the suspect ulcer was injected with a total of 2.5 cc of 1-10,000 epinephrine with the sclero-therapy needle. Subsequently, the vessel was closed with application of 3 instinct clips.  Good hemostasis maintained with these maneuvers. Finally, biopsy of abnormal distal esophagus taken for histologic study.   COMPLICATIONS:  None  IMPRESSION:    Gastric ulcer-likely NSAID-related with bleeding stigmata status post injection and clipping therapy;  "satellite" erosions and ulcerations;  intense erythema and superficial erosions of the antrum. Multilobulated submucosal duodenal Mass-not manipulated. Probable Barrett's esophagus  -  status post biopsy. Noncritical Schatzki's ring. Hiatal hernia.  RECOMMENDATIONS: Clear liquid diet today; advance in the morning as tolerated.  Continue IV PPI infusion for another 48 hours.  All NSAIDs are absolutely contraindicated at this time. Check H. pylori serologies to doublecheck his status (gastric biopsies previously negative for infection). Followup on pathology.  Degree of leukocytosis seen today a little unusual for peptic ulcer disease - even with bleeding. This needs to be followed. He may need further imaging. Dr. Laural Golden may have more information regarding the prior workup of the duodenal lesion. We'll plan to get his input tomorrow morning.        _______________________________ R. Garfield Cornea, MD FACP [facility] eSigned:  R. Legrand Como  Tydarius Yawn, MD FACP [facility] 03/29/2013 10:13 AM     CC:  PATIENT NAME:  Zygmunt, Mcglinn MR#: [id number]

## 2013-03-29 NOTE — Progress Notes (Signed)
[facility] PROGRESS NOTE  Samuel Tanner BWI:[id number] DOB: [birth year] DOA: 03/28/2013 PCP: Delphina Cahill, MD  Assessment/Plan: 1. Acute GI bleed. Patient underwent endoscopy on 3/15 with results as below. Gastric ulcer which was likely NSAID related was treated with clipping therapy. Hemoglobin has since been stable. He is continued on Protonix infusion for now. Continue clear liquid diet. 2. Acute blood loss anemia. Hemoglobin appears stable. No indication for transfusion at this point. 3. Hyperlipidemia. Continue statin  Code Status: Full code Family Communication: Discussed with patient and wife at the bedside Disposition Plan: Discharge home once improved   Consultants:  Gastroenterology  Procedures: EGD 3/15: Gastric ulcer-likely NSAID-related with bleeding  stigmata status post injection and clipping therapy; "satellite"  erosions and ulcerations; intense erythema and superficial  erosions of the antrum.  Multilobulated submucosal duodenal Mass-not manipulated.  Probable Barrett's esophagus - status post biopsy. Noncritical  Schatzki's ring. Hiatal hernia    Antibiotics:    HPI/Subjective: Feeling better. No abdominal pain or vomiting. Feels that stools are turning brown now  Objective: Filed Vitals:   03/29/13 1420  BP: 130/79  Pulse: 66  Temp: 97.7 F (36.5 C)  Resp: 18    Intake/Output Summary (Last 24 hours) at 03/29/13 1920 Last data filed at 03/29/13 1800  Gross per 24 hour  Intake    600 ml  Output      0 ml  Net    600 ml   Filed Weights   03/28/13 1353 03/28/13 1645  Weight: 98.884 kg (218 lb) 100.4 kg (221 lb 5.5 oz)    Exam:   General:  NAD  Cardiovascular: S1, S2, regular rate and rhythm  Respiratory: Clear to auscultation bilaterally  Abdomen: Soft, nontender, positive bowel sounds  Musculoskeletal: No pedal edema bilaterally   Data Reviewed: Basic Metabolic Panel:  Recent Labs Lab 03/28/13 1430 03/29/13 0214  NA  140 144  K 4.0 4.4  CL 105 109  CO2 24 25  GLUCOSE 122* 111*  BUN 44* 36*  CREATININE 1.02 1.15  CALCIUM 8.8 8.7   Liver Function Tests:  Recent Labs Lab 03/28/13 1430  AST 20  ALT 22  ALKPHOS 61  BILITOT 0.8  PROT 7.0  ALBUMIN 3.5    Recent Labs Lab 03/28/13 1430  LIPASE 22   No results found for this basename: AMMONIA,  in the last 168 hours CBC:  Recent Labs Lab 03/28/13 1430 03/28/13 1833 03/29/13 0214 03/29/13 1048 03/29/13 1808  WBC 15.2* 16.3* 20.2* 13.8* 13.8*  NEUTROABS 9.7*  --   --   --   --   HGB 14.1 13.0 12.7* 12.0* 11.7*  HCT 40.9 38.2* 37.2* 35.5* 34.5*  MCV 94.2 94.3 94.2 95.2 95.0  PLT 388 393 395 360 358   Cardiac Enzymes: No results found for this basename: CKTOTAL, CKMB, CKMBINDEX, TROPONINI,  in the last 168 hours BNP (last 3 results) No results found for this basename: PROBNP,  in the last 8760 hours CBG: No results found for this basename: GLUCAP,  in the last 168 hours  Recent Results (from the past 240 hour(s))  SURGICAL PCR SCREEN     Status: None   Collection Time    03/29/13  4:36 AM      Result Value Ref Range Status   MRSA, PCR NEGATIVE  NEGATIVE Final   Staphylococcus aureus NEGATIVE  NEGATIVE Final   Comment:            The Xpert SA Assay (FDA  approved for NASAL specimens     in patients over 18 years of age),     is one component of     a comprehensive surveillance     program.  Test performance has     been validated by [employer] for patients greater     than or equal to 79 year old.     It is not intended     to diagnose infection nor to     guide or monitor treatment.     Studies: No results found.  Scheduled Meds: . atorvastatin  80 mg Oral q1800  . EPINEPHrine      . lidocaine      . meperidine      . midazolam      . ondansetron      . pantoprazole (PROTONIX) IV  40 mg Intravenous Once  . sodium chloride  3 mL Intravenous Q12H   Continuous Infusions: . sodium chloride    .  pantoprozole (PROTONIX) infusion 8 mg/hr (03/28/13 1919)    Active Problems:   HYPERLIPIDEMIA-MIXED   GI bleed   Acute blood loss anemia   Leukocytosis, unspecified    Time spent: 61mins    Madelline Eshbach  [facility] Pager [phone]. If 7PM-7AM, please contact night-coverage at [email or web address], password [facility] 03/29/2013, 7:20 PM  LOS: 1 day

## 2013-03-29 NOTE — Progress Notes (Signed)
Utilization review Completed Dayelin Balducci RN BSN   

## 2013-03-30 DIAGNOSIS — K259 Gastric ulcer, unspecified as acute or chronic, without hemorrhage or perforation: Secondary | ICD-10-CM

## 2013-03-30 DIAGNOSIS — K922 Gastrointestinal hemorrhage, unspecified: Secondary | ICD-10-CM

## 2013-03-30 DIAGNOSIS — D62 Acute posthemorrhagic anemia: Secondary | ICD-10-CM

## 2013-03-30 DIAGNOSIS — K2270 Barrett's esophagus without dysplasia: Secondary | ICD-10-CM

## 2013-03-30 DIAGNOSIS — D72829 Elevated white blood cell count, unspecified: Secondary | ICD-10-CM

## 2013-03-30 LAB — CBC
HCT: 34.9 % — ABNORMAL LOW (ref 39.0–52.0)
HCT: 36 % — ABNORMAL LOW (ref 39.0–52.0)
Hemoglobin: 11.7 g/dL — ABNORMAL LOW (ref 13.0–17.0)
Hemoglobin: 12.3 g/dL — ABNORMAL LOW (ref 13.0–17.0)
MCH: 31.7 pg (ref 26.0–34.0)
MCH: 32.5 pg (ref 26.0–34.0)
MCHC: 33.5 g/dL (ref 30.0–36.0)
MCHC: 34.2 g/dL (ref 30.0–36.0)
MCV: 94.6 fL (ref 78.0–100.0)
MCV: 95 fL (ref 78.0–100.0)
PLATELETS: 365 10*3/uL (ref 150–400)
Platelets: 384 10*3/uL (ref 150–400)
RBC: 3.69 MIL/uL — AB (ref 4.22–5.81)
RBC: 3.79 MIL/uL — ABNORMAL LOW (ref 4.22–5.81)
RDW: 12.9 % (ref 11.5–15.5)
RDW: 12.8 % (ref 11.5–15.5)
WBC: 13.4 10*3/uL — ABNORMAL HIGH (ref 4.0–10.5)
WBC: 10.8 10*3/uL — ABNORMAL HIGH (ref 4.0–10.5)

## 2013-03-30 LAB — OCCULT BLOOD, POC DEVICE: Fecal Occult Bld: POSITIVE — AB

## 2013-03-30 MED ORDER — PANTOPRAZOLE SODIUM 40 MG PO TBEC: 40.0000 mg | DELAYED_RELEASE_TABLET | Freq: Two times a day (BID) | ORAL | Status: DC

## 2013-03-30 NOTE — Progress Notes (Signed)
Discharge instructions and prescriptions given, verbalized understanding, out in stable condition with staff via w/c. 

## 2013-03-30 NOTE — Progress Notes (Signed)
Subjective; Patient has no complaints. He had no difficulty with hot healthy diet at lunch. He denies nausea vomiting or abdominal pain. His stool is less dark and not as loose.  Objective; BP 119/76  Pulse 68  Temp(Src) 97.9 F (36.6 C) (Oral)  Resp 20  Ht 6\' 1"  (1.854 m)  Wt 221 lb 5.5 oz (100.4 kg)  BMI 29.21 kg/m2  SpO2 95% Patient is alert and in no acute distress. Abdomen is soft and nontender no organomegaly or masses. No LE edema noted.  Lab data H&H from this morning 11.7 and 34.9 CBC from 10 AM today; WBC 10.8, H&H 12.3 and 36 and platelet count 384K.  Records from [facility] reviewed; EUS with FNA of a duodenal lesion in August 2011 reveals Brunner gland hypertrophy. Repeat EUS with FNA in May 2012 again reveals Erick Blinks gland hypertrophy. Esophageal biopsy pending.  Assessment; #1. Upper GI bleed secondary to NSAID-induced gastric ulcer. H&H is coming up. Patient tolerating diet and without symptoms. #2. Mild anemia secondary to GI blood loss. Leukocytosis has resolved and would appear most likely secondary to UGIB. #3. 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 It appears to be stable then endoscopic pictures from 2011 compared with study from yesterday. No further workup needed.  Recommendations; Switch to pantoprazole 40 mg by mouth twice a day. Patient stable from GI point to go home today. Patient and informed not to take OTC NSAIDs. He is also aware that he cannot undergo MRI until clips of passed. I would be contacting him with biopsy results and plan to see him in the office 4 weeks.

## 2013-03-30 NOTE — Care Management Note (Addendum)
    Page 1 of 1   03/30/2013     3:17:40 PM   CARE MANAGEMENT NOTE 03/30/2013  Patient:  Samuel Tanner, Samuel Tanner   Account Number:  [id number]  Date Initiated:  03/30/2013  Documentation initiated by:  Theophilus Kinds  Subjective/Objective Assessment:   Pt admitted from home with gi bleed. Pt lives with his wife and will return home at discharge. Pt is independent with ADL's.     Action/Plan:   No CM needs noted.   Anticipated DC Date:  03/31/2013   Anticipated DC Plan:  [place]  CM consult      Choice offered to / List presented to:             Status of service:  Completed, signed off Medicare Important Message given?  NA - LOS <3 / Initial given by admissions (If response is "NO", the following Medicare IM given date fields will be blank) Date Medicare IM given:   Date Additional Medicare IM given:    Discharge Disposition:  HOME/SELF CARE  Per UR Regulation:    If discussed at Long Length of Stay Meetings, dates discussed:    Comments:  03/30/13 [place], RN BSN CM

## 2013-03-30 NOTE — Discharge Summary (Signed)
Physician Discharge Summary  [name]:[id number] DOB: December 13, 1950 DOA: 03/28/2013  PCP: Delphina Cahill, MD  Admit date: 03/28/2013 Discharge date: 03/30/2013  Time spent: 40 minutes  Recommendations for Outpatient Follow-up:  1. Followup with Dr. Laural Golden in 4 weeks  Discharge Diagnoses:  Active Problems:   HYPERLIPIDEMIA-MIXED   GI bleed   Acute blood loss anemia   Leukocytosis, unspecified   Discharge Condition: improved  Diet recommendation: low salt  Filed Weights   03/28/13 1353 03/28/13 1645  Weight: 98.884 kg (218 lb) 100.4 kg (221 lb 5.5 oz)    History of present illness:  Samuel Tanner is a 63 y.o. male presents to the hospital with GI bleeding. Patient reports experiencing diaphoresis, lightheadedness and presyncopal episodes earlier this morning. Around noon the patient had a dark tarry stool. He presents to the hospital for evaluation where he's had 2 dark colored stools since then. Initial hemoglobin is having normal at 14. The patient reports that he rarely uses Alka-Seltzer, aspirin and Aleve. He's not had any vomiting. Denies any abdominal pain at this time. He was started on Protonix in the emergency room. Gastrology seen and started on a Protonix infusion. He'll be admitted for further evaluation   Hospital Course:  This patient was admitted to the hospital with melena. He underwent EGD and was found to have a gastric ulcer which was likely NSAID related. The patient was started on hypertonic confusion. Hemostasis was achieved with clipping. His hemoglobin has remained stable. He did have a mild decline from 14-12.3. He did not require transfusions of PRBCs. He is feeling quite well at this time. Diet has been advanced to a solid diet he is tolerating this well. He has been advised to not use any nonsteroidal anti-inflammatory medications. He is on twice a day Protonix. He'll followup with gastroenterology in 4 weeks  Consultants:  Gastroenterology Procedures:   EGD 3/15: Gastric ulcer-likely NSAID-related with bleeding  stigmata status post injection and clipping therapy; "satellite"  erosions and ulcerations; intense erythema and superficial  erosions of the antrum.  Multilobulated submucosal duodenal Mass-not manipulated.  Probable Barrett's esophagus - status post biopsy. Noncritical  Schatzki's ring. Hiatal hernia   Discharge Exam: Filed Vitals:   03/30/13 0452  BP: 119/76  Pulse: 68  Temp: 97.9 F (36.6 C)  Resp: 20    General: No acute distress Cardiovascular: S1, S2, regular rate and rhythm Respiratory: Clear to auscultation bilaterally  Discharge Instructions  Discharge Orders   Future Orders Complete By Expires   Diet - low sodium heart healthy  As directed    Increase activity slowly  As directed        Medication List    STOP taking these medications       ALKA-SELTZER PLUS COLD PO      TAKE these medications       fish oil-omega-3 fatty acids 1000 MG capsule  Take 1 g by mouth daily.     pantoprazole 40 MG tablet  Commonly known as:  PROTONIX  Take 1 tablet (40 mg total) by mouth 2 (two) times daily before a meal.     rosuvastatin 40 MG tablet  Commonly known as:  CRESTOR  Take 1 tablet (40 mg total) by mouth daily.       Allergies  Allergen Reactions  . Asa [Aspirin]     PT HAS PEPTIC ULCER  . Morphine And Related     hallucinations       Follow-up Information  Follow up with REHMAN,NAJEEB U, MD. Schedule an appointment as soon as possible for a visit in 4 weeks.   Specialty:  Gastroenterology   Contact information:   [address], [address] [place] [address] [phone]       Follow up with Delphina Cahill, MD. Schedule an appointment as soon as possible for a visit in 2 weeks.   Specialty:  Internal Medicine   Contact information:    [place] [address] [phone]        The results of significant diagnostics from this hospitalization (including imaging,  microbiology, ancillary and laboratory) are listed below for reference.    Significant Diagnostic Studies: No results found.  Microbiology: Recent Results (from the past 240 hour(s))  SURGICAL PCR SCREEN     Status: None   Collection Time    03/29/13  4:36 AM      Result Value Ref Range Status   MRSA, PCR NEGATIVE  NEGATIVE Final   Staphylococcus aureus NEGATIVE  NEGATIVE Final   Comment:            The Xpert SA Assay (FDA     approved for NASAL specimens     in patients over 34 years of age),     is one component of     a comprehensive surveillance     program.  Test performance has     been validated by [employer] for patients greater     than or equal to 9 year old.     It is not intended     to diagnose infection nor to     guide or monitor treatment.     Labs: Basic Metabolic Panel:  Recent Labs Lab 03/28/13 1430 03/29/13 0214  NA 140 144  K 4.0 4.4  CL 105 109  CO2 24 25  GLUCOSE 122* 111*  BUN 44* 36*  CREATININE 1.02 1.15  CALCIUM 8.8 8.7   Liver Function Tests:  Recent Labs Lab 03/28/13 1430  AST 20  ALT 22  ALKPHOS 61  BILITOT 0.8  PROT 7.0  ALBUMIN 3.5    Recent Labs Lab 03/28/13 1430  LIPASE 22   No results found for this basename: AMMONIA,  in the last 168 hours CBC:  Recent Labs Lab 03/28/13 1430  03/29/13 0214 03/29/13 1048 03/29/13 1808 03/30/13 0233 03/30/13 0959  WBC 15.2*  < > 20.2* 13.8* 13.8* 13.4* 10.8*  NEUTROABS 9.7*  --   --   --   --   --   --   HGB 14.1  < > 12.7* 12.0* 11.7* 11.7* 12.3*  HCT 40.9  < > 37.2* 35.5* 34.5* 34.9* 36.0*  MCV 94.2  < > 94.2 95.2 95.0 94.6 95.0  PLT 388  < > 395 360 358 365 384  < > = values in this interval not displayed. Cardiac Enzymes: No results found for this basename: CKTOTAL, CKMB, CKMBINDEX, TROPONINI,  in the last 168 hours BNP: BNP (last 3 results) No results found for this basename: PROBNP,  in the last 8760 hours CBG: No results found for this basename:  GLUCAP,  in the last 168 hours     Signed:  Kari Kerth  [facility] 03/30/2013, 3:15 PM

## 2013-03-30 NOTE — Discharge Instructions (Signed)
Gastrointestinal Bleeding °Gastrointestinal (GI) bleeding means there is bleeding somewhere along the digestive tract, between the mouth and anus. °CAUSES  °There are many different problems that can cause GI bleeding. Possible causes include: °· Esophagitis. This is inflammation, irritation, or swelling of the esophagus. °· Hemorrhoids. These are veins that are full of blood (engorged) in the rectum. They cause pain, inflammation, and may bleed. °· Anal fissures. These are areas of painful tearing which may bleed. They are often caused by passing hard stool. °· Diverticulosis. These are pouches that form on the colon over time, with age, and may bleed significantly. °· Diverticulitis. This is inflammation in areas with diverticulosis. It can cause pain, fever, and bloody stools, although bleeding is rare. °· Polyps and cancer. Colon cancer often starts out as precancerous polyps. °· Gastritis and ulcers. Bleeding from the upper gastrointestinal tract (near the stomach) may travel through the intestines and produce black, sometimes tarry, often bad smelling stools. In certain cases, if the bleeding is fast enough, the stools may not be black, but red. This condition may be life-threatening. °SYMPTOMS  °· Vomiting bright red blood or material that looks like coffee grounds. °· Bloody, black, or tarry stools. °DIAGNOSIS  °Your caregiver may diagnose your condition by taking your history and performing a physical exam. More tests may be needed, including: °· X-rays and other imaging tests. °· Esophagogastroduodenoscopy (EGD). This test uses a flexible, lighted tube to look at your esophagus, stomach, and small intestine. °· Colonoscopy. This test uses a flexible, lighted tube to look at your colon. °TREATMENT  °Treatment depends on the cause of your bleeding.  °· For bleeding from the esophagus, stomach, small intestine, or colon, the caregiver doing your EGD or colonoscopy may be able to stop the bleeding as part of  the procedure. °· Inflammation or infection of the colon can be treated with medicines. °· Many rectal problems can be treated with creams, suppositories, or warm baths. °· Surgery is sometimes needed. °· Blood transfusions are sometimes needed if you have lost a lot of blood. °If bleeding is slow, you may be allowed to go home. If there is a lot of bleeding, you will need to stay in the hospital for observation. °HOME CARE INSTRUCTIONS  °· Take any medicines exactly as prescribed. °· Keep your stools soft by eating foods that are high in fiber. These foods include whole grains, legumes, fruits, and vegetables. Prunes (1 to 3 a day) work well for many people. °· Drink enough fluids to keep your urine clear or pale yellow. °SEEK IMMEDIATE MEDICAL CARE IF:  °· Your bleeding increases. °· You feel lightheaded, weak, or you faint. °· You have severe cramps in your back or abdomen. °· You pass large blood clots in your stool. °· Your problems are getting worse. °MAKE SURE YOU:  °· Understand these instructions. °· Will watch your condition. °· Will get help right away if you are not doing well or get worse. °Document Released: 12/30/1999 Document Revised: 12/19/2011 Document Reviewed: 12/11/2010 °ExitCare® Patient Information ©2014 [employer]. ° °

## 2013-03-31 ENCOUNTER — Encounter (HOSPITAL_COMMUNITY): Payer: Self-pay | Admitting: Internal Medicine

## 2013-04-05 ENCOUNTER — Encounter: Payer: Self-pay | Admitting: Internal Medicine

## 2013-04-15 ENCOUNTER — Telehealth: Payer: Self-pay | Admitting: *Deleted

## 2013-04-15 MED ORDER — ROSUVASTATIN CALCIUM 40 MG PO TABS: 40.0000 mg | ORAL_TABLET | Freq: Every day | ORAL | Status: DC

## 2013-04-15 NOTE — Telephone Encounter (Signed)
Medication sent via escribe.  

## 2013-04-15 NOTE — Telephone Encounter (Signed)
Pt needs crestor 40 mg called in to [facility] in [place] [phone]

## 2013-04-16 ENCOUNTER — Telehealth: Payer: Self-pay | Admitting: Cardiology

## 2013-04-16 MED ORDER — ROSUVASTATIN CALCIUM 40 MG PO TABS: 40.0000 mg | ORAL_TABLET | Freq: Every day | ORAL | Status: DC

## 2013-04-16 NOTE — Telephone Encounter (Signed)
Refill complete 

## 2013-04-16 NOTE — Telephone Encounter (Signed)
Pt requested [facility] in [place] not [facility].

## 2013-04-27 ENCOUNTER — Encounter (INDEPENDENT_AMBULATORY_CARE_PROVIDER_SITE_OTHER): Payer: Self-pay | Admitting: Internal Medicine

## 2013-04-27 ENCOUNTER — Ambulatory Visit (INDEPENDENT_AMBULATORY_CARE_PROVIDER_SITE_OTHER): Payer: Medicare Other | Admitting: Internal Medicine

## 2013-04-27 VITALS — BP 106/74 | HR 76 | Temp 95.9°F | Resp 18 | Ht 73.0 in | Wt 219.0 lb

## 2013-04-27 DIAGNOSIS — K2270 Barrett's esophagus without dysplasia: Secondary | ICD-10-CM

## 2013-04-27 DIAGNOSIS — K219 Gastro-esophageal reflux disease without esophagitis: Secondary | ICD-10-CM

## 2013-04-27 DIAGNOSIS — K279 Peptic ulcer, site unspecified, unspecified as acute or chronic, without hemorrhage or perforation: Secondary | ICD-10-CM

## 2013-04-27 DIAGNOSIS — D649 Anemia, unspecified: Secondary | ICD-10-CM

## 2013-04-27 DIAGNOSIS — K589 Irritable bowel syndrome without diarrhea: Secondary | ICD-10-CM

## 2013-04-27 MED ORDER — PANTOPRAZOLE SODIUM 40 MG PO TBEC: 40.0000 mg | DELAYED_RELEASE_TABLET | Freq: Two times a day (BID) | ORAL | Status: DC

## 2013-04-27 MED ORDER — DICYCLOMINE HCL 10 MG PO CAPS: 10.0000 mg | ORAL_CAPSULE | Freq: Two times a day (BID) | ORAL | Status: DC

## 2013-04-27 NOTE — Patient Instructions (Signed)
Next blood work in 4 weeks. Call if you have black stools or rectal bleeding

## 2013-04-27 NOTE — Progress Notes (Signed)
Presenting complaint;  Followup for peptic ulcer disease and anemia.  Database;  Patient is 63 year old Caucasian male who was hospitalized 4 weeks ago melena and anemia. He was evaluated by Dr. Gala Romney during my absence. EGD revealed 2.5 cm tongue of salmon-colored mucosa and biopsy confirmed Barrett's esophagus without dysplasia. He was found to have gastric ulcer with stigmata of bleeding treated with combination of injection therapy and clip application. EGD also revealed small sliding hiatal hernia noncardiac Schatzki's ring and multilobulated due to no mass which has previously been poorly evaluated and felt to be Erick Blinks gland hypertrophy(EUS with FNA in August 2011 and redo in May 2012 at [facility]). Patient's hemoglobin dropped by 2 g but he did not require transfusion.   Subjective:  Patient states he feels better. His appetite is very good. He feels he has not gotten his strength back. One week ago he was working in his yard on a warm day and felt weak and lightheaded but he did not pass out. He has not experienced melena or rectal bleeding. He has noted intermittent pain in left upper quadrant of abdomen. This pain is not unbearable. He also complains of intermittent postprandial diarrhea. He denies dysuria or hematuria. Pain seemed to get worse with change in his posture. He states he has learned his lesson and will never take OTC NSAIDs.  he received letter from Dr. Roseanne Kaufman office regarding his official biopsy results and he was advised to follow up exam in future.  Current Medications: Outpatient Encounter Prescriptions as of 04/27/2013  Medication Sig  . ferrous sulfate 325 (65 FE) MG tablet Take 325 mg by mouth daily with breakfast.  . fish oil-omega-3 fatty acids 1000 MG capsule Take 1 g by mouth daily.   . pantoprazole (PROTONIX) 40 MG tablet Take 1 tablet (40 mg total) by mouth 2 (two) times daily before a meal.  . rosuvastatin (CRESTOR) 40 MG tablet Take 1 tablet (40 mg total) by  mouth daily.     Objective: Blood pressure 106/74, pulse 76, temperature 95.9 F (35.5 C), temperature source Oral, resp. rate 18, height 6\' 1"  (1.854 m), weight 219 lb (99.338 kg). Patient is alert and in no acute distress. Conjunctiva is pink. Sclera is nonicteric Oropharyngeal mucosa is normal. No neck masses or thyromegaly noted. Cardiac exam with regular rhythm normal S1 and S2. No murmur or gallop noted. Lungs are clear to auscultation. Abdomen is full but soft and nontender without organomegaly or masses.  No LE edema or clubbing noted.  Labs/studies Results: Lab data from 04/03/2013. WBC 12.0, H&H 12.9 and 36.7 and platelet count of 528K  Assessment:  #1. Upper GI bleed 4 weeks ago secondary to NSAID-induced gastric ulcer requiring endoscopic therapy. He also experienced upper GI bleed secondary to PUD due to onset therapy in July 2011. H. pylori serology has been negative in the past. H&H is slowly coming up. #2. New diagnosis of short segment Barrett's. There is no history of chronic heartburn. With this diagnosis he would need to be treated with PPI chronically. #3. 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 Over 3 years ago he was evaluated by Dr. Tressie Stalker and workup was negative. #4. 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 No further workup needed. #5. Irritable bowel syndrome. He has typical symptoms. Last colonoscopy was in 2010 at [facility].   Plan:  Dicyclomine 10 mg once or twice daily. Continue pantoprazole at 40 mg by mouth twice a day scheduled for now. CBC in 4  weeks. Office visit in 6 months. Will consider colonoscopy following that visit. Consider EGD in March, 2018.

## 2013-05-13 ENCOUNTER — Encounter (INDEPENDENT_AMBULATORY_CARE_PROVIDER_SITE_OTHER): Payer: Self-pay | Admitting: *Deleted

## 2013-05-13 ENCOUNTER — Other Ambulatory Visit (INDEPENDENT_AMBULATORY_CARE_PROVIDER_SITE_OTHER): Payer: Self-pay | Admitting: *Deleted

## 2013-05-13 DIAGNOSIS — D649 Anemia, unspecified: Secondary | ICD-10-CM

## 2013-05-14 ENCOUNTER — Ambulatory Visit: Payer: Medicare Other | Admitting: Cardiology

## 2013-05-20 LAB — CBC WITH DIFFERENTIAL/PLATELET
Basophils Absolute: 0.1 10*3/uL (ref 0.0–0.1)
Basophils Relative: 1 % (ref 0–1)
Eosinophils Absolute: 0.5 10*3/uL (ref 0.0–0.7)
Eosinophils Relative: 4 % (ref 0–5)
HCT: 43.6 % (ref 39.0–52.0)
Hemoglobin: 15.1 g/dL (ref 13.0–17.0)
Lymphocytes Relative: 32 % (ref 12–46)
Lymphs Abs: 3.6 10*3/uL (ref 0.7–4.0)
MCH: 31 pg (ref 26.0–34.0)
MCHC: 34.6 g/dL (ref 30.0–36.0)
MCV: 89.5 fL (ref 78.0–100.0)
Monocytes Absolute: 1.6 10*3/uL — ABNORMAL HIGH (ref 0.1–1.0)
Monocytes Relative: 14 % — ABNORMAL HIGH (ref 3–12)
Neutro Abs: 5.5 10*3/uL (ref 1.7–7.7)
Neutrophils Relative %: 49 % (ref 43–77)
Platelets: 527 10*3/uL — ABNORMAL HIGH (ref 150–400)
RBC: 4.87 MIL/uL (ref 4.22–5.81)
RDW: 13.1 % (ref 11.5–15.5)
WBC: 11.3 10*3/uL — ABNORMAL HIGH (ref 4.0–10.5)

## 2013-05-25 ENCOUNTER — Encounter: Payer: Self-pay | Admitting: Cardiology

## 2013-05-25 ENCOUNTER — Ambulatory Visit (INDEPENDENT_AMBULATORY_CARE_PROVIDER_SITE_OTHER): Payer: Medicare Other | Admitting: Cardiology

## 2013-05-25 VITALS — BP 94/62 | HR 70 | Ht 73.0 in | Wt 222.0 lb

## 2013-05-25 DIAGNOSIS — E785 Hyperlipidemia, unspecified: Secondary | ICD-10-CM

## 2013-05-25 DIAGNOSIS — I2510 Atherosclerotic heart disease of native coronary artery without angina pectoris: Secondary | ICD-10-CM

## 2013-05-25 NOTE — Progress Notes (Signed)
Clinical Summary Mr. Hanf is a 63 y.o.male  1. CAD - prior stent to LCX - last cath 2009 with patent stent, overall non-obstructive disease. LVEF at that time showed normal LV function. - from prior notes developed peptic ulcer of ASA 81mg  daily, recent admit with GI bleeding thought secondary to NSAID use (aleve).  - denies any chest pain. Denies any SOB or DOE - compliant with meds  2. Hyperlipidemia - compliant with crestor 20mg  at night  3. GI bleed - recent admit 04/2013, gastric ulcer by EGD likely NSAID related.  - followed by GI  Past Medical History  Diagnosis Date  . Hyperlipidemia     mixed  . Chest tightness, discomfort, or pressure   . Appendiceal carcinoid tumor     resection  15 yrs ago  . Ulcer   . MI (myocardial infarction)   . Barrett esophagus 03/29/13    Preformed by Dr.Rourk     Allergies  Allergen Reactions  . Asa [Aspirin]     PT HAS PEPTIC ULCER  . Morphine And Related     hallucinations     Current Outpatient Prescriptions  Medication Sig Dispense Refill  . dicyclomine (BENTYL) 10 MG capsule Take 1 capsule (10 mg total) by mouth 2 (two) times daily before a meal.  60 capsule  5  . ferrous sulfate 325 (65 FE) MG tablet Take 325 mg by mouth daily with breakfast.      . fish oil-omega-3 fatty acids 1000 MG capsule Take 1 g by mouth daily.       . pantoprazole (PROTONIX) 40 MG tablet Take 1 tablet (40 mg total) by mouth 2 (two) times daily before a meal.  60 tablet  5  . rosuvastatin (CRESTOR) 40 MG tablet Take 1 tablet (40 mg total) by mouth daily.  90 tablet  3   No current facility-administered medications for this visit.     Past Surgical History  Procedure Laterality Date  .  appendiceal carcinoid       resection 15 yrs sgo  . Cholecystectomy    . Cardiac catheterization      06/27/2007 and 09/17/2001  . Esophagogastroduodenoscopy N/A 03/29/2013    Procedure: ESOPHAGOGASTRODUODENOSCOPY (EGD);  Surgeon: Daneil Dolin, MD;   [facility];  Service: Endoscopy;  Laterality: N/A;  . Upper gastrointestinal endoscopy      03/29/13     Allergies  Allergen Reactions  . Asa [Aspirin]     PT HAS PEPTIC ULCER  . Morphine And Related     hallucinations      Family History  Problem Relation Age of Onset  . Heart attack Father   . Cancer Father     prostate cancer  . Heart disease Father      Social History Mr. Bucklin reports that he has never smoked. He has never used smokeless tobacco. Mr. Derousse reports that he does not drink alcohol.   Review of Systems CONSTITUTIONAL: No weight loss, fever, chills, weakness or fatigue.  HEENT: Eyes: No visual loss, blurred vision, double vision or yellow sclerae.No hearing loss, sneezing, congestion, runny nose or sore throat.  SKIN: No rash or itching.  CARDIOVASCULAR: per HPI RESPIRATORY: No shortness of breath, cough or sputum.  GASTROINTESTINAL: No anorexia, nausea, vomiting or diarrhea. No abdominal pain or blood.  GENITOURINARY: No burning on urination, no polyuria NEUROLOGICAL: No headache, dizziness, syncope, paralysis, ataxia, numbness or tingling in the extremities. No change in bowel or bladder  control.  MUSCULOSKELETAL: No muscle, back pain, joint pain or stiffness.  LYMPHATICS: No enlarged nodes. No history of splenectomy.  PSYCHIATRIC: No history of depression or anxiety.  ENDOCRINOLOGIC: No reports of sweating, cold or heat intolerance. No polyuria or polydipsia.  Marland Kitchen   Physical Examination p 70 bp 94/62 Wt 222 lbs BMI 29 Gen: resting comfortably, no acute distress HEENT: no scleral icterus, pupils equal round and reactive, no palptable cervical adenopathy,  CV: RRR< no m/r/g, no JVD, no carotid bruits Resp: Clear to auscultation bilaterally GI: abdomen is soft, non-tender, non-distended, normal bowel sounds, no hepatosplenomegaly MSK: extremities are warm, no edema.  Skin: warm, no rash Neuro:  no focal deficits Psych: appropriate  affect   Diagnostic Studies 06/2007 Cath HEMODYNAMIC DATA:  1. Central aortic pressure was 118/78.  2. Left ventricular pressure 104/9.  3. There was no gradient on pullback across the aortic valve.  ANGIOGRAPHIC DATA:  1. Ventriculography done in the RAO projection reveals vigorous global  systolic function. No segmental abnormalities are identified.  There was a small inferior diverticulum, and this had been noted on  the previous angiographic study.  2. The left main is free of critical disease.  3. The LAD has a clear-cut fairly focal mid stenosis of about 40%. It  does not appear to be high-grade or flow-limiting. The vessel then  goes distally to the apex where there was mild luminal irregularity  after the diagonal. The diagonal itself is a small bifurcating  vessel without critical narrowing.  4. The circumflex provides a large marginal branch that has been  stented. There is a perhaps about 20% narrowing in the midportion  of the stent which corresponds to the area seen on intravascular  ultrasound. Distally the vessel trifurcates. It does not appear  to be flow-limiting.  5. The right coronary artery has mild luminal irregularity but no  significant focal stenosis.  6. Intravascular ultrasound was performed. There was good apposition  throughout. The overall minimum lumen diameter appeared to be  appropriate for the stent being between 3.25 and 3.4 throughout  most of the stent. In the midportion of the stent, there is a very  small filling defect that could represent tissue prolapse. Its  density is that of some plaque, although a small amount of layering  thrombus cannot be excluded.  CONCLUSION:  1. Preserved left ventricular function.  2. Mild disease of the left anterior descending artery.  3. Normal-appearing right coronary artery.  4. Continued patency of the previously placed stent with a small area  of filling defect possibly representing small area of plaque,   tissue prolapse, and/or thrombus.  DISPOSITION: The patient will be treated medically. We will increase  his Plavix to b.i.d. for about 1 week, then resume his normal amount.  We will need to monitor his symptoms prospectively.     Assessment and Plan  1. CAD - no current symptoms - continue risk factor modification and secondary prevention - consider plavix as secondary prevention once current ulcers resolve, continue to avoid ASA  2. Hyperlipidemia - continue current statin, reports upcoming pcp appt with labs  3. GI bleed - history of prior GI bleeds, all seem to be NSAID related including prior episode on ASA 81mg  per notes - avoid NSAIDs, avoid ASA  F/u 6 months    Arnoldo Lenis, M.D., F.A.C.C.

## 2013-05-25 NOTE — Patient Instructions (Signed)
Your physician wants you to follow-up in: 6 months You will receive a reminder letter in the mail two months in advance. If you don't receive a letter, please call our office to schedule the follow-up appointment.     Your physician recommends that you continue on your current medications as directed. Please refer to the Current Medication list given to you today.      Thank you for choosing [facility] !        

## 2013-06-03 ENCOUNTER — Other Ambulatory Visit (INDEPENDENT_AMBULATORY_CARE_PROVIDER_SITE_OTHER): Payer: Self-pay | Admitting: Internal Medicine

## 2013-06-03 DIAGNOSIS — K2270 Barrett's esophagus without dysplasia: Secondary | ICD-10-CM

## 2013-06-03 DIAGNOSIS — K279 Peptic ulcer, site unspecified, unspecified as acute or chronic, without hemorrhage or perforation: Secondary | ICD-10-CM

## 2013-06-03 DIAGNOSIS — K219 Gastro-esophageal reflux disease without esophagitis: Secondary | ICD-10-CM

## 2013-06-03 MED ORDER — PANTOPRAZOLE SODIUM 40 MG PO TBEC: 40.0000 mg | DELAYED_RELEASE_TABLET | Freq: Two times a day (BID) | ORAL | Status: DC

## 2013-06-03 NOTE — Telephone Encounter (Signed)
Rx Protonix refilled

## 2013-06-10 ENCOUNTER — Telehealth: Payer: Self-pay | Admitting: General Practice

## 2013-06-10 NOTE — Telephone Encounter (Signed)
Patient came into the office and dropped of some bills he had a question about.  I told him I would follow-up with him.  I called the patient to discuss, no answer,lmom

## 2013-06-18 ENCOUNTER — Encounter (INDEPENDENT_AMBULATORY_CARE_PROVIDER_SITE_OTHER): Payer: Self-pay

## 2013-06-18 ENCOUNTER — Encounter (INDEPENDENT_AMBULATORY_CARE_PROVIDER_SITE_OTHER): Payer: Self-pay | Admitting: *Deleted

## 2013-06-29 ENCOUNTER — Other Ambulatory Visit (INDEPENDENT_AMBULATORY_CARE_PROVIDER_SITE_OTHER): Payer: Self-pay | Admitting: *Deleted

## 2013-06-29 ENCOUNTER — Telehealth (INDEPENDENT_AMBULATORY_CARE_PROVIDER_SITE_OTHER): Payer: Self-pay | Admitting: *Deleted

## 2013-06-29 DIAGNOSIS — Z860100 Personal history of colon polyps, unspecified: Secondary | ICD-10-CM

## 2013-06-29 DIAGNOSIS — Z800 Family history of malignant neoplasm of digestive organs: Secondary | ICD-10-CM

## 2013-06-29 DIAGNOSIS — Z1211 Encounter for screening for malignant neoplasm of colon: Secondary | ICD-10-CM

## 2013-06-29 NOTE — Telephone Encounter (Signed)
  Procedure: tcs  Reason/Indication:  fam hx colon ca, hx polyps  Has patient had this procedure before?  Yes, 2010 -- scanned  If so, when, by whom and where?    Is there a family history of colon cancer?  Yes, father  Who?  What age when diagnosed?    Is patient diabetic?   no      Does patient have prosthetic heart valve?  no  Do you have a pacemaker?  no  Has patient ever had endocarditis? no  Has patient had joint replacement within last 12 months?  no  Does patient tend to be constipated or take laxatives? no  Is patient on Coumadin, Plavix and/or Aspirin? no  Medications: crestor 20 mg daily, pantoprazole 40 mg daily, dicyclomine 10 mg daily  Allergies: morphine  Medication Adjustment:   Procedure date & time: 07/30/13 at 1030

## 2013-06-29 NOTE — Telephone Encounter (Signed)
Patient needs movi prep 

## 2013-06-29 NOTE — Telephone Encounter (Signed)
agree

## 2013-07-01 ENCOUNTER — Emergency Department (HOSPITAL_COMMUNITY): Payer: Medicare Other

## 2013-07-01 ENCOUNTER — Encounter (HOSPITAL_COMMUNITY): Payer: Self-pay | Admitting: Emergency Medicine

## 2013-07-01 ENCOUNTER — Observation Stay (HOSPITAL_COMMUNITY)
Admission: EM | Admit: 2013-07-01 | Discharge: 2013-07-02 | Disposition: A | Discharge status: Home / Self Care | Payer: Medicare Other | Attending: Internal Medicine | Admitting: Internal Medicine

## 2013-07-01 DIAGNOSIS — Z8509 Personal history of malignant neoplasm of other digestive organs: Secondary | ICD-10-CM | POA: Insufficient documentation

## 2013-07-01 DIAGNOSIS — R079 Chest pain, unspecified: Secondary | ICD-10-CM

## 2013-07-01 DIAGNOSIS — K219 Gastro-esophageal reflux disease without esophagitis: Secondary | ICD-10-CM

## 2013-07-01 DIAGNOSIS — K2270 Barrett's esophagus without dysplasia: Secondary | ICD-10-CM | POA: Insufficient documentation

## 2013-07-01 DIAGNOSIS — Z79899 Other long term (current) drug therapy: Secondary | ICD-10-CM | POA: Insufficient documentation

## 2013-07-01 DIAGNOSIS — D72829 Elevated white blood cell count, unspecified: Secondary | ICD-10-CM

## 2013-07-01 DIAGNOSIS — E785 Hyperlipidemia, unspecified: Secondary | ICD-10-CM

## 2013-07-01 DIAGNOSIS — Z872 Personal history of diseases of the skin and subcutaneous tissue: Secondary | ICD-10-CM | POA: Insufficient documentation

## 2013-07-01 DIAGNOSIS — Z9889 Other specified postprocedural states: Secondary | ICD-10-CM | POA: Insufficient documentation

## 2013-07-01 DIAGNOSIS — I2510 Atherosclerotic heart disease of native coronary artery without angina pectoris: Secondary | ICD-10-CM

## 2013-07-01 DIAGNOSIS — I252 Old myocardial infarction: Secondary | ICD-10-CM | POA: Insufficient documentation

## 2013-07-01 DIAGNOSIS — R0789 Other chest pain: Principal | ICD-10-CM | POA: Insufficient documentation

## 2013-07-01 DIAGNOSIS — R42 Dizziness and giddiness: Secondary | ICD-10-CM | POA: Insufficient documentation

## 2013-07-01 LAB — CBC WITH DIFFERENTIAL/PLATELET
BASOS ABS: 0.1 10*3/uL (ref 0.0–0.1)
BASOS PCT: 1 % (ref 0–1)
EOS PCT: 2 % (ref 0–5)
Eosinophils Absolute: 0.3 10*3/uL (ref 0.0–0.7)
HCT: 46.1 % (ref 39.0–52.0)
Hemoglobin: 15.9 g/dL (ref 13.0–17.0)
Lymphocytes Relative: 34 % (ref 12–46)
Lymphs Abs: 3.9 10*3/uL (ref 0.7–4.0)
MCH: 31.4 pg (ref 26.0–34.0)
MCHC: 34.5 g/dL (ref 30.0–36.0)
MCV: 91.1 fL (ref 78.0–100.0)
Monocytes Absolute: 1.7 10*3/uL — ABNORMAL HIGH (ref 0.1–1.0)
Monocytes Relative: 15 % — ABNORMAL HIGH (ref 3–12)
NEUTROS ABS: 5.5 10*3/uL (ref 1.7–7.7)
Neutrophils Relative %: 48 % (ref 43–77)
Platelets: 489 10*3/uL — ABNORMAL HIGH (ref 150–400)
RBC: 5.06 MIL/uL (ref 4.22–5.81)
RDW: 13 % (ref 11.5–15.5)
WBC: 11.4 10*3/uL — AB (ref 4.0–10.5)

## 2013-07-01 LAB — COMPREHENSIVE METABOLIC PANEL
ALBUMIN: 3.9 g/dL (ref 3.5–5.2)
ALK PHOS: 76 U/L (ref 39–117)
ALT: 27 U/L (ref 0–53)
AST: 28 U/L (ref 0–37)
BUN: 20 mg/dL (ref 6–23)
CALCIUM: 9.3 mg/dL (ref 8.4–10.5)
CO2: 23 mEq/L (ref 19–32)
CREATININE: 1.34 mg/dL (ref 0.50–1.35)
Chloride: 103 mEq/L (ref 96–112)
GFR calc non Af Amer: 54 mL/min — ABNORMAL LOW (ref >90)
GFR, EST AFRICAN AMERICAN: 63 mL/min — AB (ref >90)
GLUCOSE: 90 mg/dL (ref 70–99)
POTASSIUM: 4 meq/L (ref 3.7–5.3)
Sodium: 140 mEq/L (ref 137–147)
TOTAL PROTEIN: 7.4 g/dL (ref 6.0–8.3)
Total Bilirubin: 0.5 mg/dL (ref 0.3–1.2)

## 2013-07-01 LAB — TROPONIN I
Troponin I: <0.3 ng/mL (ref <0.30)
Troponin I: <0.3 ng/mL (ref <0.30)

## 2013-07-01 LAB — PRO B NATRIURETIC PEPTIDE: Pro B Natriuretic peptide (BNP): 29.9 pg/mL (ref 0–125)

## 2013-07-01 MED ORDER — DICYCLOMINE HCL 10 MG PO CAPS: 10.0000 mg | ORAL_CAPSULE | Freq: Every day | ORAL | Status: DC

## 2013-07-01 MED ORDER — NITROGLYCERIN 0.4 MG SL SUBL: 0.4000 mg | SUBLINGUAL_TABLET | SUBLINGUAL | Status: DC | PRN

## 2013-07-01 MED ORDER — ENOXAPARIN SODIUM 60 MG/0.6ML ~~LOC~~ SOLN: 50.0000 mg | SUBCUTANEOUS | Status: DC

## 2013-07-01 MED ORDER — ONDANSETRON HCL 4 MG PO TABS: 4.0000 mg | ORAL_TABLET | Freq: Four times a day (QID) | ORAL | Status: DC | PRN

## 2013-07-01 MED ORDER — SODIUM CHLORIDE 0.9 % IJ SOLN: 3.0000 mL | Freq: Two times a day (BID) | INTRAMUSCULAR | Status: DC

## 2013-07-01 MED ORDER — ONDANSETRON HCL 4 MG/2ML IJ SOLN: 4.0000 mg | Freq: Four times a day (QID) | INTRAMUSCULAR | Status: DC | PRN

## 2013-07-01 MED ORDER — PANTOPRAZOLE SODIUM 40 MG PO TBEC
40.0000 mg | DELAYED_RELEASE_TABLET | Freq: Two times a day (BID) | ORAL | Status: DC
  Administered 2013-07-01: 40 mg via ORAL
  Filled 2013-07-01: qty 1

## 2013-07-01 MED ORDER — ATORVASTATIN CALCIUM 40 MG PO TABS
40.0000 mg | ORAL_TABLET | Freq: Every day | ORAL | Status: DC
  Administered 2013-07-01: 40 mg via ORAL
  Filled 2013-07-01: qty 1

## 2013-07-01 MED ORDER — ACETAMINOPHEN 650 MG RE SUPP: 650.0000 mg | Freq: Four times a day (QID) | RECTAL | Status: DC | PRN

## 2013-07-01 MED ORDER — SODIUM CHLORIDE 0.9 % IV SOLN
INTRAVENOUS | Status: DC
  Administered 2013-07-01 – 2013-07-02 (×2): via INTRAVENOUS

## 2013-07-01 MED ORDER — ENOXAPARIN SODIUM 40 MG/0.4ML ~~LOC~~ SOLN
40.0000 mg | SUBCUTANEOUS | Status: DC
  Filled 2013-07-01: qty 0.4

## 2013-07-01 MED ORDER — ACETAMINOPHEN 325 MG PO TABS: 650.0000 mg | ORAL_TABLET | Freq: Four times a day (QID) | ORAL | Status: DC | PRN

## 2013-07-01 MED ORDER — IOHEXOL 350 MG/ML SOLN
100.0000 mL | Freq: Once | INTRAVENOUS | Status: AC | PRN
  Administered 2013-07-01: 100 mL via INTRAVENOUS

## 2013-07-01 MED ORDER — SODIUM CHLORIDE 0.9 % IV SOLN
INTRAVENOUS | Status: DC
  Administered 2013-07-01: 12:00:00 via INTRAVENOUS

## 2013-07-01 MED ORDER — ZOLPIDEM TARTRATE 5 MG PO TABS
5.0000 mg | ORAL_TABLET | Freq: Every evening | ORAL | Status: DC | PRN
  Administered 2013-07-01: 5 mg via ORAL
  Filled 2013-07-01: qty 1

## 2013-07-01 NOTE — ED Provider Notes (Signed)
CSN: [id number]     Arrival date & time 07/01/13  1130 History   This chart was scribed for Leota Jacobsen, MD by Girtha Hake, ED Scribe. The patient was seen in [facility]. The patient's care was started at 11:40 AM.     Chief Complaint  Patient presents with  . Shortness of Breath     The history is provided by the patient. No language interpreter was used.   HPI Comments: Samuel Tanner is a 63 y.o. male who presents to the Emergency Department complaining of intermittent episodes of sudden onset dizziness and SOB that began yesterday. Patient states that his first episode occurred while he was working in his yard at approximately 6:00 AM yesterday. He says that he had another episode of dizziness and SOB while at the pharmacy earlier today. He states that this episode resolved spontaneously after a period of rest. Patient reports that he experienced similar episodes approximately five years ago. He had a cardiac catheterization with stent placement at that time. He also has a history of MI. Patient denies taking nitroglycerin today. He denies fever, cough, headache, nausea, vomiting, and diarrhea. Patient has a history anemia in the past treated with oral iron supplements.  Cardiologist is Dr. Harl Bowie with [facility]. PCP is Dr. Nevada Crane.   Past Medical History  Diagnosis Date  . Hyperlipidemia     mixed  . Chest tightness, discomfort, or pressure   . Appendiceal carcinoid tumor     resection  15 yrs ago  . Ulcer   . MI (myocardial infarction)   . Barrett esophagus 03/29/13    Preformed by Dr.Rourk   Past Surgical History  Procedure Laterality Date  .  appendiceal carcinoid       resection 15 yrs sgo  . Cholecystectomy    . Cardiac catheterization      06/27/2007 and 09/17/2001  . Esophagogastroduodenoscopy N/A 03/29/2013    Procedure: ESOPHAGOGASTRODUODENOSCOPY (EGD);  Surgeon: Daneil Dolin, MD;  Location: [facility];  Service: Endoscopy;  Laterality: N/A;  .  Upper gastrointestinal endoscopy      03/29/13   Family History  Problem Relation Age of Onset  . Heart attack Father   . Cancer Father     prostate cancer  . Heart disease Father    History  Substance Use Topics  . Smoking status: Never Smoker   . Smokeless tobacco: Never Used  . Alcohol Use: No    Review of Systems  Constitutional: Negative for fever and diaphoresis.  HENT: Negative for congestion, rhinorrhea and sore throat.   Respiratory: Positive for chest tightness and shortness of breath. Negative for cough.   Cardiovascular: Negative for leg swelling.  Gastrointestinal: Negative for nausea, vomiting and diarrhea.  Musculoskeletal: Negative for back pain and neck pain.  Neurological: Positive for dizziness. Negative for speech difficulty, weakness, numbness and headaches.  Psychiatric/Behavioral: Negative for confusion.  All other systems reviewed and are negative.   Allergies  Asa and Morphine and related  Home Medications   Prior to Admission medications   Medication Sig Start Date End Date Taking? Authorizing Denitra Donaghey  dicyclomine (BENTYL) 10 MG capsule Take 1 capsule (10 mg total) by mouth 2 (two) times daily before a meal. 04/27/13   Rogene Houston, MD  fish oil-omega-3 fatty acids 1000 MG capsule Take 1 g by mouth daily.     Historical Sofiya Ezelle, MD  pantoprazole (PROTONIX) 40 MG tablet Take 1 tablet (40 mg total) by mouth 2 (two)  times daily before a meal. 06/03/13   Butch Penny, NP  rosuvastatin (CRESTOR) 40 MG tablet Take 20 mg by mouth daily. 04/16/13   Arnoldo Lenis, MD   Triage Vitals: BP 129/87  Pulse 66  Temp(Src) 97.4 F (36.3 C) (Oral)  Resp 20  Ht 6\' 1"  (1.854 m)  Wt 220 lb (99.791 kg)  BMI 29.03 kg/m2  SpO2 98% Physical Exam  Nursing note and vitals reviewed. Constitutional: He is oriented to person, place, and time. He appears well-developed and well-nourished.  Non-toxic appearance. No distress.  HENT:  Head: Normocephalic and  atraumatic.  Eyes: Conjunctivae, EOM and lids are normal. Pupils are equal, round, and reactive to light.  Neck: Normal range of motion. Neck supple. No tracheal deviation present. No mass present.  Cardiovascular: Normal rate, regular rhythm and normal heart sounds.  Exam reveals no gallop.   No murmur heard. Pulmonary/Chest: Effort normal and breath sounds normal. No stridor. No respiratory distress. He has no decreased breath sounds. He has no wheezes. He has no rhonchi. He has no rales.  Abdominal: Soft. Normal appearance and bowel sounds are normal. He exhibits no distension. There is no tenderness. There is no rebound and no CVA tenderness.  Musculoskeletal: Normal range of motion. He exhibits no edema and no tenderness.  Neurological: He is alert and oriented to person, place, and time. He has normal strength. No cranial nerve deficit or sensory deficit. GCS eye subscore is 4. GCS verbal subscore is 5. GCS motor subscore is 6.  Skin: Skin is warm and dry. No abrasion and no rash noted.  Psychiatric: He has a normal mood and affect. His speech is normal and behavior is normal.    ED Course  Procedures (including critical care time) DIAGNOSTIC STUDIES: Oxygen Saturation is 98% on room air, normal by my interpretation.    COORDINATION OF CARE: 11:48 AM-Discussed treatment plan which includes IV fluids, chest CT, EKG, and labs with pt at bedside and pt agreed to plan.     Labs Review Labs Reviewed  CBC WITH DIFFERENTIAL - Abnormal; Notable for the following:    WBC 11.4 (*)    Platelets 489 (*)    Monocytes Relative 15 (*)    Monocytes Absolute 1.7 (*)    All other components within normal limits  COMPREHENSIVE METABOLIC PANEL - Abnormal; Notable for the following:    GFR calc non Af Amer 54 (*)    GFR calc Af Amer 63 (*)    All other components within normal limits  TROPONIN I  PRO B NATRIURETIC PEPTIDE    Imaging Review Ct Angio Chest Pe W/cm &/or Wo Cm  07/01/2013    CLINICAL DATA:  Chest pain  EXAM: CT ANGIOGRAPHY CHEST WITH CONTRAST  TECHNIQUE: Multidetector CT imaging of the chest was performed using the standard protocol during bolus administration of intravenous contrast. Multiplanar CT image reconstructions and MIPs were obtained to evaluate the vascular anatomy.  CONTRAST:  172mL OMNIPAQUE IOHEXOL 350 MG/ML SOLN  COMPARISON:  Chest radiograph July 30, 2009  FINDINGS: There is no demonstrable pulmonary embolus. There is no thoracic aortic aneurysm or dissection.  There is centrilobular emphysematous change there is lower lobe bronchiectatic change bilaterally. There is patchy atelectasis in the lung bases bilaterally, more on the left than on the right. There is no frank edema or consolidation.  There are multiple foci of coronary artery calcification. Pericardium is not thickened. There is no demonstrable thoracic adenopathy.  In the visualized upper  abdomen, there is fatty change in the liver. There is no apparent splenic tissue; question previous splenectomy. There is degenerative change in the thoracic spine. There are no blastic or lytic bone lesions. Visualized thyroid appears normal.  Review of the MIP images confirms the above findings.  IMPRESSION: No demonstrable pulmonary embolus. Underlying emphysema with lower lobe bronchiectatic change. Mild bibasilar atelectatic change. Question previous splenectomy. Fatty liver. Multiple foci of coronary artery calcification present.   Electronically Signed   By: Lowella Grip M.D.   On: 07/01/2013 12:53     EKG Interpretation   Date/Time:  Wednesday July 01 2013 11:41:51 EDT Ventricular Rate:  68 PR Interval:  193 QRS Duration: 103 QT Interval:  419 QTC Calculation: 446 R Axis:   77 Text Interpretation:  Sinus rhythm Low voltage, precordial leads No  significant change since last tracing Confirmed by ALLEN  MD, ANTHONY  ([id number]) on 07/01/2013 11:46:27 AM      MDM   Final diagnoses:  None   Patient  had chest CT with which was negative for PE. This was performed because the patient did have exertional dyspnea without evidence of CHF. Concern for angina as cause of the symptoms and will be admitted for further evaluation of his chest pain    Leota Jacobsen, MD 07/01/13 1453

## 2013-07-01 NOTE — ED Notes (Signed)
Chest tightness and sob with dizziness x 2 days.

## 2013-07-01 NOTE — H&P (Signed)
[facility] History and Physical  Samuel Tanner QMG:[id number] DOB: 05-02-1950 DOA: 07/01/2013  Referring physician: Dr. Zenia Resides, ER physician PCP: Delphina Cahill, MD   Chief Complaint: chest pain  HPI: Samuel Tanner is a 63 y.o. male with history of coronary artery disease and cardiac stent placed in the past, most recent cardiac catheterization was 3 years ago in [place] and was reported to show nonobstructive disease per patient. Patient reports that he was in his usual state of health when 2 days ago he began experiencing chest pressure. Patient was working in his yard at that time, and began to develop substernal chest pressure with associated lightheadedness and diaphoresis. He reports in the last for 10-15 minutes and then resolved with rest. He's had several episodes over the past 2 days, mostly occurring while standing up/exerting himself. Relieved with rest. He does not have any fever, cough, dysuria, vomiting, diarrhea or any other complaints. He was evaluated in the emergency room where CT and judicious found to be unremarkable. EKG was nonacute and cardiac enzymes are negative. Due to his clinical presentation, he is being admitted for further observation.   Review of Systems:  Pertinent positives as per HPI, otherwise negative  Past Medical History  Diagnosis Date  . Hyperlipidemia     mixed  . Chest tightness, discomfort, or pressure   . Appendiceal carcinoid tumor     resection  15 yrs ago  . Ulcer   . MI (myocardial infarction)   . Barrett esophagus 03/29/13    Preformed by Dr.Rourk   Past Surgical History  Procedure Laterality Date  .  appendiceal carcinoid       resection 15 yrs sgo  . Cholecystectomy    . Cardiac catheterization      06/27/2007 and 09/17/2001  . Esophagogastroduodenoscopy N/A 03/29/2013    Procedure: ESOPHAGOGASTRODUODENOSCOPY (EGD);  Surgeon: Daneil Dolin, MD;  [facility];  Service: Endoscopy;  Laterality: N/A;  . Upper  gastrointestinal endoscopy      03/29/13   Social History:  reports that he has never smoked. He has never used smokeless tobacco. He reports that he does not drink alcohol or use illicit drugs.  Allergies  Allergen Reactions  . Asa [Aspirin]     PT HAS PEPTIC ULCER  . Morphine And Related     hallucinations    Family History  Problem Relation Age of Onset  . Heart attack Father   . Cancer Father     prostate cancer  . Heart disease Father      Prior to Admission medications   Medication Sig Start Date End Date Taking? Authorizing Provider  dicyclomine (BENTYL) 10 MG capsule Take 10 mg by mouth daily. 04/27/13  Yes Rogene Houston, MD  fish oil-omega-3 fatty acids 1000 MG capsule Take 1 g by mouth daily.    Yes Historical Provider, MD  pantoprazole (PROTONIX) 40 MG tablet Take 1 tablet (40 mg total) by mouth 2 (two) times daily before a meal. 06/03/13  Yes Butch Penny, NP  rosuvastatin (CRESTOR) 40 MG tablet Take 20 mg by mouth at bedtime.  04/16/13  Yes Arnoldo Lenis, MD   Physical Exam: Filed Vitals:   07/01/13 1602  BP: 140/83  Pulse: 63  Temp: 98 F (36.7 C)  Resp: 18    BP 140/83  Pulse 63  Temp(Src) 98 F (36.7 C) (Oral)  Resp 18  Ht 6\' 1"  (1.854 m)  Wt 98.158 kg (216 lb 6.4  oz)  BMI 28.56 kg/m2  SpO2 98%  General:  Appears calm and comfortable Eyes: PERRL, normal lids, irises & conjunctiva ENT: grossly normal hearing, lips & tongue Neck: no LAD, masses or thyromegaly Cardiovascular: RRR, no m/r/g. No LE edema. Telemetry: SR, no arrhythmias  Respiratory: CTA bilaterally, no w/r/r. Normal respiratory effort. Abdomen: soft, ntnd Skin: no rash or induration seen on limited exam Musculoskeletal: grossly normal tone BUE/BLE Psychiatric: grossly normal mood and affect, speech fluent and appropriate Neurologic: grossly non-focal.          Labs on Admission:  Basic Metabolic Panel:  Recent Labs Lab 07/01/13 1147  NA 140  K 4.0  CL 103  CO2 23    GLUCOSE 90  BUN 20  CREATININE 1.34  CALCIUM 9.3   Liver Function Tests:  Recent Labs Lab 07/01/13 1147  AST 28  ALT 27  ALKPHOS 76  BILITOT 0.5  PROT 7.4  ALBUMIN 3.9   No results found for this basename: LIPASE, AMYLASE,  in the last 168 hours No results found for this basename: AMMONIA,  in the last 168 hours CBC:  Recent Labs Lab 07/01/13 1147  WBC 11.4*  NEUTROABS 5.5  HGB 15.9  HCT 46.1  MCV 91.1  PLT 489*   Cardiac Enzymes:  Recent Labs Lab 07/01/13 1147  TROPONINI <0.30    BNP (last 3 results)  Recent Labs  07/01/13 1147  PROBNP 29.9   CBG: No results found for this basename: GLUCAP,  in the last 168 hours  Radiological Exams on Admission: Ct Angio Chest Pe W/cm &/or Wo Cm  07/01/2013   CLINICAL DATA:  Chest pain  EXAM: CT ANGIOGRAPHY CHEST WITH CONTRAST  TECHNIQUE: Multidetector CT imaging of the chest was performed using the standard protocol during bolus administration of intravenous contrast. Multiplanar CT image reconstructions and MIPs were obtained to evaluate the vascular anatomy.  CONTRAST:  139mL OMNIPAQUE IOHEXOL 350 MG/ML SOLN  COMPARISON:  Chest radiograph July 30, 2009  FINDINGS: There is no demonstrable pulmonary embolus. There is no thoracic aortic aneurysm or dissection.  There is centrilobular emphysematous change there is lower lobe bronchiectatic change bilaterally. There is patchy atelectasis in the lung bases bilaterally, more on the left than on the right. There is no frank edema or consolidation.  There are multiple foci of coronary artery calcification. Pericardium is not thickened. There is no demonstrable thoracic adenopathy.  In the visualized upper abdomen, there is fatty change in the liver. There is no apparent splenic tissue; question previous splenectomy. There is degenerative change in the thoracic spine. There are no blastic or lytic bone lesions. Visualized thyroid appears normal.  Review of the MIP images confirms the  above findings.  IMPRESSION: No demonstrable pulmonary embolus. Underlying emphysema with lower lobe bronchiectatic change. Mild bibasilar atelectatic change. Question previous splenectomy. Fatty liver. Multiple foci of coronary artery calcification present.   Electronically Signed   By: Lowella Grip M.D.   On: 07/01/2013 12:53    EKG: Independently reviewed. No acute changes  Assessment/Plan Principal Problem:   Chest pain Active Problems:   Hyperlipidemia   GERD (gastroesophageal reflux disease)   CAD (coronary artery disease)   1. Chest pain. Patient has a history of coronary artery disease. His most recent catheterization was done 3 years ago in [place]. He sees Dr. Harl Bowie in the outpatient setting. We will cycle his cardiac markers to rule out ACS. Repeat EKG in the morning. Check 2-D echocardiogram. Will keep patient n.p.o. overnight  in case procedure will need to be pursued tomorrow. The patient has a reported allergy to aspirin. This is likely not a true allergy, since patient reports that he had bleeding with aspirin in the past. We'll hold off on antiplatelet agents for now, unless cardiac enzymes are positive. If enzymes are positive, consider starting Plavix in place of aspirin. We'll request a cardiology consultation. 2. Hyperlipidemia. Continue statin. 3. GERD. Continue Protonix twice a day.  Code Status: full code Family Communication: discussed with patient Disposition Plan: discharge home once improved  Time spent: 67mins  [facility] Pager [phone]  **Disclaimer: This note may have been dictated with voice recognition software. Similar sounding words can inadvertently be transcribed and this note may contain transcription errors which may not have been corrected upon publication of note.**

## 2013-07-02 ENCOUNTER — Observation Stay (HOSPITAL_COMMUNITY): Payer: Medicare Other

## 2013-07-02 ENCOUNTER — Encounter (HOSPITAL_COMMUNITY): Payer: Self-pay

## 2013-07-02 DIAGNOSIS — I517 Cardiomegaly: Secondary | ICD-10-CM

## 2013-07-02 DIAGNOSIS — D72829 Elevated white blood cell count, unspecified: Secondary | ICD-10-CM

## 2013-07-02 DIAGNOSIS — I2510 Atherosclerotic heart disease of native coronary artery without angina pectoris: Secondary | ICD-10-CM

## 2013-07-02 LAB — BASIC METABOLIC PANEL
BUN: 17 mg/dL (ref 6–23)
CO2: 23 meq/L (ref 19–32)
Calcium: 8.9 mg/dL (ref 8.4–10.5)
Chloride: 103 mEq/L (ref 96–112)
Creatinine, Ser: 1.07 mg/dL (ref 0.50–1.35)
GFR calc Af Amer: 82 mL/min — ABNORMAL LOW (ref >90)
GFR, EST NON AFRICAN AMERICAN: 71 mL/min — AB (ref >90)
Glucose, Bld: 107 mg/dL — ABNORMAL HIGH (ref 70–99)
POTASSIUM: 4.1 meq/L (ref 3.7–5.3)
SODIUM: 140 meq/L (ref 137–147)

## 2013-07-02 LAB — TROPONIN I

## 2013-07-02 LAB — CBC
HCT: 47 % (ref 39.0–52.0)
Hemoglobin: 15.9 g/dL (ref 13.0–17.0)
MCH: 31.1 pg (ref 26.0–34.0)
MCHC: 33.8 g/dL (ref 30.0–36.0)
MCV: 92 fL (ref 78.0–100.0)
Platelets: 507 10*3/uL — ABNORMAL HIGH (ref 150–400)
RBC: 5.11 MIL/uL (ref 4.22–5.81)
RDW: 13.1 % (ref 11.5–15.5)
WBC: 12.4 10*3/uL — AB (ref 4.0–10.5)

## 2013-07-02 LAB — TSH: TSH: 1.06 u[IU]/mL (ref 0.350–4.500)

## 2013-07-02 MED ORDER — METOPROLOL TARTRATE 12.5 MG HALF TABLET: 12.5000 mg | ORAL_TABLET | Freq: Two times a day (BID) | ORAL | Status: DC

## 2013-07-02 MED ORDER — TECHNETIUM TC 99M SESTAMIBI - CARDIOLITE
30.0000 | Freq: Once | INTRAVENOUS | Status: AC | PRN
  Administered 2013-07-02: 16:00:00 30 via INTRAVENOUS

## 2013-07-02 MED ORDER — TECHNETIUM TC 99M SESTAMIBI GENERIC - CARDIOLITE
10.0000 | Freq: Once | INTRAVENOUS | Status: AC | PRN
  Administered 2013-07-02: 10 via INTRAVENOUS

## 2013-07-02 MED ORDER — SODIUM CHLORIDE 0.9 % IJ SOLN
INTRAMUSCULAR | Status: AC
  Administered 2013-07-02: 10 mL via INTRAVENOUS
  Filled 2013-07-02: qty 10

## 2013-07-02 NOTE — Discharge Instructions (Signed)

## 2013-07-02 NOTE — Progress Notes (Signed)
Stress Lab Nurses Notes - [place] 07/02/2013 Reason for doing test: Chest Pain Type of test: Stress Cardiolite / Inpatient Rm 311 Nurse performing test: Gerrit Halls, RN Nuclear Medicine Tech: Redmond Baseman Echo Tech: Not Applicable MD performing test: Jefm Bryant MD: [facility] explained and consent signed: yes IV started: No redness or edema and Saline lock from floor Symptoms: Fatigue Treatment/Intervention: None Reason test stopped: fatigue After recovery IV was: No redness or edema and Saline Lock flushed Patient to return to [place]. Med at : Patient discharged: Transported back to room 311 via wc Patient's Condition upon discharge was: stable Comments: During test peak BP 181/83 & HR 150.  Recovery BP 117/81 & HR 91.  Symptoms resolved in recovery. Geanie Cooley T

## 2013-07-02 NOTE — Progress Notes (Signed)
Negative exercise MPI, with low risk Duke treadmill score and exceptional functional capacity. No further cardiac testing at this time, appears symptoms are non-cardiac.   Carlyle Dolly MD

## 2013-07-02 NOTE — Progress Notes (Signed)
  Echocardiogram 2D Echocardiogram has been performed.  Samuel Tanner 07/02/2013, 12:27 PM

## 2013-07-02 NOTE — Consult Note (Signed)
Primary cardiologist: Dr Carlyle Dolly Consulting cardiologist: Dr Carlyle Dolly MD  Clinical Summary Samuel Tanner is a 63 y.o.male with a history of CAD with prior stenting to LCX, last cath 2009 with patent LCX stent and overall non-obstructive CAD, hx of hyperlipidemia, and GI bleed with gastric ulcer by EG 04/2013 thought related to NSAIDs admitted with chest pain.   Reports starting early Monday morning while doing yardwork had episode primarily of feeling very flushed and diaphoretic. He felt SOB and dizzy at this time as well, no palpitations.  Mild 2/10 chest pressure in mid chest that lasted for just a few minutes, the flushed feeling lasted long and resolved after sitting for 10-15 minutes. Nothing made the pain better or worst.  Went back to yardwork and did not have recurrence of pain but for the of the day just felt generally fatigued and low energy.    EKG NSR with no ischemic changes, trop neg x 4. K 4.1, Cr 1.07, WBC 12.4, Hgb 15.9, Plt 507, TSH 1.06, pro-BNP 29.9.  Allergies  Allergen Reactions  . Asa [Aspirin]     PT HAS PEPTIC ULCER  . Morphine And Related     hallucinations    Medications Scheduled Medications: . atorvastatin  40 mg Oral q1800  . dicyclomine  10 mg Oral Daily  . enoxaparin (LOVENOX) injection  40 mg Subcutaneous Q24H  . pantoprazole  40 mg Oral BID AC  . sodium chloride  3 mL Intravenous Q12H     Infusions: . sodium chloride 50 mL/hr at 07/01/13 1859     PRN Medications:  acetaminophen, acetaminophen, nitroGLYCERIN, ondansetron (ZOFRAN) IV, ondansetron, zolpidem   Past Medical History  Diagnosis Date  . Hyperlipidemia     mixed  . Chest tightness, discomfort, or pressure   . Appendiceal carcinoid tumor     resection  15 yrs ago  . Ulcer   . MI (myocardial infarction)   . Barrett esophagus 03/29/13    Preformed by Dr.Rourk    Past Surgical History  Procedure Laterality Date  .  appendiceal carcinoid       resection 15 yrs  sgo  . Cholecystectomy    . Cardiac catheterization      06/27/2007 and 09/17/2001  . Esophagogastroduodenoscopy N/A 03/29/2013    Procedure: ESOPHAGOGASTRODUODENOSCOPY (EGD);  Surgeon: Daneil Dolin, MD;  Location: [facility];  Service: Endoscopy;  Laterality: N/A;  . Upper gastrointestinal endoscopy      03/29/13    Family History  Problem Relation Age of Onset  . Heart attack Father   . Cancer Father     prostate cancer  . Heart disease Father     Social History Samuel Tanner reports that he has never smoked. He has never used smokeless tobacco. Samuel Tanner reports that he does not drink alcohol.  Review of Systems CONSTITUTIONAL: No weight loss, fever, chills, weakness or fatigue.  HEENT: Eyes: No visual loss, blurred vision, double vision or yellow sclerae. No hearing loss, sneezing, congestion, runny nose or sore throat.  SKIN: No rash or itching.  CARDIOVASCULAR: No chest pain, chest pressure or chest discomfort. No palpitations or edema.  RESPIRATORY: No shortness of breath, cough or sputum.  GASTROINTESTINAL: No anorexia, nausea, vomiting or diarrhea. No abdominal pain or blood.  GENITOURINARY: no polyuria, no dysuria NEUROLOGICAL: No headache, dizziness, syncope, paralysis, ataxia, numbness or tingling in the extremities. No change in bowel or bladder control.  MUSCULOSKELETAL: No muscle, back pain, joint pain or stiffness.  HEMATOLOGIC: No anemia, bleeding or bruising.  LYMPHATICS: No enlarged nodes. No history of splenectomy.  PSYCHIATRIC: No history of depression or anxiety.      Physical Examination Blood pressure 130/89, pulse 69, temperature 98.1 F (36.7 C), temperature source Oral, resp. rate 18, height 6\' 1"  (1.854 m), weight 216 lb 6.4 oz (98.158 kg), SpO2 97.00%.  Intake/Output Summary (Last 24 hours) at 07/02/13 1120 Last data filed at 07/02/13 0647  Gross per 24 hour  Intake 790.83 ml  Output    300 ml  Net 490.83 ml    Cardiovascular: RRR, no m/r/g,  no JVD  Respiratory: CTAB  GI: abdomen soft, NT, ND  MSK: no LE edema  Neuro: no focal deficits  Psych: appropriate affect   Lab Results  Basic Metabolic Panel:  Recent Labs Lab 07/01/13 1147 07/02/13 0555  NA 140 140  K 4.0 4.1  CL 103 103  CO2 23 23  GLUCOSE 90 107*  BUN 20 17  CREATININE 1.34 1.07  CALCIUM 9.3 8.9    Liver Function Tests:  Recent Labs Lab 07/01/13 1147  AST 28  ALT 27  ALKPHOS 76  BILITOT 0.5  PROT 7.4  ALBUMIN 3.9    CBC:  Recent Labs Lab 07/01/13 1147 07/02/13 0555  WBC 11.4* 12.4*  NEUTROABS 5.5  --   HGB 15.9 15.9  HCT 46.1 47.0  MCV 91.1 92.0  PLT 489* 507*    Cardiac Enzymes:  Recent Labs Lab 07/01/13 1147 07/01/13 1815 07/01/13 2310 07/02/13 0555  TROPONINI <0.30 <0.30 <0.30 <0.30    BNP: No components found with this basename: POCBNP,    ECG   Imaging 06/2007 Cath  HEMODYNAMIC DATA:  1. Central aortic pressure was 118/78.  2. Left ventricular pressure 104/9.  3. There was no gradient on pullback across the aortic valve.  ANGIOGRAPHIC DATA:  1. Ventriculography done in the RAO projection reveals vigorous global  systolic function. No segmental abnormalities are identified.  There was a small inferior diverticulum, and this had been noted on  the previous angiographic study.  2. The left main is free of critical disease.  3. The LAD has a clear-cut fairly focal mid stenosis of about 40%. It  does not appear to be high-grade or flow-limiting. The vessel then  goes distally to the apex where there was mild luminal irregularity  after the diagonal. The diagonal itself is a small bifurcating  vessel without critical narrowing.  4. The circumflex provides a large marginal Samuel Tanner that has been  stented. There is a perhaps about 20% narrowing in the midportion  of the stent which corresponds to the area seen on intravascular  ultrasound. Distally the vessel trifurcates. It does not appear  to be  flow-limiting.  5. The right coronary artery has mild luminal irregularity but no  significant focal stenosis.  6. Intravascular ultrasound was performed. There was good apposition  throughout. The overall minimum lumen diameter appeared to be  appropriate for the stent being between 3.25 and 3.4 throughout  most of the stent. In the midportion of the stent, there is a very  small filling defect that could represent tissue prolapse. Its  density is that of some plaque, although a small amount of layering  thrombus cannot be excluded.  CONCLUSION:  1. Preserved left ventricular function.  2. Mild disease of the left anterior descending artery.  3. Normal-appearing right coronary artery.  4. Continued patency of the previously placed stent with a small area  of  filling defect possibly representing small area of plaque,  tissue prolapse, and/or thrombus.  DISPOSITION: The patient will be treated medically. We will increase  his Plavix to b.i.d. for about 1 week, then resume his normal amount.  We will need to monitor his symptoms prospectively.  07/01/13 CT PE IMPRESSION:  No demonstrable pulmonary embolus. Underlying emphysema with lower  lobe bronchiectatic change. Mild bibasilar atelectatic change.  Question previous splenectomy. Fatty liver. Multiple foci of  coronary artery calcification present.    Impression/Recommendations 1. CAD - symptoms of mild chest pressure on off with associated SOB, diaphoresis, and dizziness. No evidence of ACS at this point by EKG or enzymes. Of note he is not currently on any antianginals at home. Will obtain exercise MPI to further risk stratify potential disease, start low dose metoprolol after exercise MPI as antianginal therapy.  - not on ASA due to history of peptic ulces on ASA 81mg , and recent admit with GI bleeding thought secondary to aleve. Gastric ulcer by EGD 04/2013.  - f/u echo ordered for today - will see if can get stress test this  afternoon, if not tomorrow AM  2. Hx of GI bleed - admit 03/2013 with melena, EGD shwoed ulcer that was injected and clipped. His Hgb was decreased but did not require transfusion, seemed to have been associated with NSAIDs.  - likely start plavix for secondary prevention now that he is further out from recent GI bleed, continue to avoid NSAIDs at this time.   Carlyle Dolly, M.D., F.A.C.C.

## 2013-07-03 NOTE — Discharge Summary (Signed)
Physician Discharge Summary  Samuel Tanner JKD:[id number] DOB: 1950/07/13 DOA: 07/01/2013  PCP: Delphina Cahill, MD  Admit date: 07/01/2013 Discharge date: 07/03/2013  Time spent: 40 minutes  Recommendations for Outpatient Follow-up:  1. Follow up with cardiology in 2 weeks  Discharge Diagnoses:  Principal Problem:   Chest pain Active Problems:   Hyperlipidemia   GERD (gastroesophageal reflux disease)   CAD (coronary artery disease)   Discharge Condition: improved  Diet recommendation: low salt  Filed Weights   07/01/13 1137 07/01/13 1602  Weight: 99.791 kg (220 lb) 98.158 kg (216 lb 6.4 oz)    History of present illness:  Samuel Tanner is a 63 y.o. male with history of coronary artery disease and cardiac stent placed in the past, most recent cardiac catheterization was 3 years ago in [place] and was reported to show nonobstructive disease per patient. Patient reports that he was in his usual state of health when 2 days ago he began experiencing chest pressure. Patient was working in his yard at that time, and began to develop substernal chest pressure with associated lightheadedness and diaphoresis. He reports in the last for 10-15 minutes and then resolved with rest. He's had several episodes over the past 2 days, mostly occurring while standing up/exerting himself. Relieved with rest. He does not have any fever, cough, dysuria, vomiting, diarrhea or any other complaints. He was evaluated in the emergency room where CT and judicious found to be unremarkable. EKG was nonacute and cardiac enzymes are negative. Due to his clinical presentation, he is being admitted for further observation.   Hospital Course:  This patient was admitted to the hospital with chest pain. He ruled out for ACS with negative cardiac markers and nonacute EKG. CT angiogram of the chest was negative for pulmonary embolus. Due to his significant cardiac history, he was seen by cardiology and underwent stress  test. This study was negative for any ischemia. The patient has had recent GI bleeding and has not been taking any antiplatelet agents, specifically aspirin. He does not take any nonsteroidal anti-inflammatories. He was considered starting him on Plavix as an antiplatelet agent, but this will be further discussed with the patient by cardiology on followup. He was started on Lopressor as an antianginal agent. At discharge, patient is not having any further pain. He is feeling significantly improved. He is stable for discharge.  Procedures: Echo: - Left ventricle: The cavity size was normal. Wall thickness was normal. Systolic function was normal. The estimated ejection fraction was in the range of 60% to 65%. Doppler parameters are consistent with abnormal left ventricular relaxation (grade 1 diastolic dysfunction). - Aortic valve: Mildly calcified annulus. Normal thickness leaflets. Valve area (VTI): 2.39 cm^2. Valve area (Vmax): 2.63 cm^2. - Mitral valve: Mildly calcified annulus. Normal thickness leaflets . - Left atrium: The atrium was mildly dilated. - Technically difficult study.    Consultations:  cardiology  Discharge Exam: Filed Vitals:   07/02/13 0435  BP: 130/89  Pulse: 69  Temp: 98.1 F (36.7 C)  Resp: 18    General: NAD Cardiovascular: s1, s2, rrr Respiratory: cta b  Discharge Instructions You were cared for by a hospitalist during your hospital stay. If you have any questions about your discharge medications or the care you received while you were in the hospital after you are discharged, you can call the unit and asked to speak with the hospitalist on call if the hospitalist that took care of you is not available. Once you are  discharged, your primary care physician will handle any further medical issues. Please note that NO REFILLS for any discharge medications will be authorized once you are discharged, as it is imperative that you return to your primary care  physician (or establish a relationship with a primary care physician if you do not have one) for your aftercare needs so that they can reassess your need for medications and monitor your lab values.  Discharge Instructions   Call MD for:  severe uncontrolled pain    Complete by:  As directed      Call MD for:  temperature >100.4    Complete by:  As directed      Diet - low sodium heart healthy    Complete by:  As directed      Increase activity slowly    Complete by:  As directed             Medication List         dicyclomine 10 MG capsule  Commonly known as:  BENTYL  Take 10 mg by mouth daily.     fish oil-omega-3 fatty acids 1000 MG capsule  Take 1 g by mouth daily.     metoprolol tartrate 12.5 mg Tabs tablet  Commonly known as:  LOPRESSOR  Take 0.5 tablets (12.5 mg total) by mouth 2 (two) times daily.     pantoprazole 40 MG tablet  Commonly known as:  PROTONIX  Take 1 tablet (40 mg total) by mouth 2 (two) times daily before a meal.     rosuvastatin 40 MG tablet  Commonly known as:  CRESTOR  Take 20 mg by mouth at bedtime.       Allergies  Allergen Reactions  . Asa [Aspirin]     PT HAS PEPTIC ULCER  . Morphine And Related     hallucinations       Follow-up Information   Follow up with Delphina Cahill, MD. Schedule an appointment as soon as possible for a visit in 2 weeks.   Specialty:  Internal Medicine   Contact information:    [place] [address] [phone]       Follow up with Carlyle Dolly, F, MD. Schedule an appointment as soon as possible for a visit in 2 weeks.   Specialty:  Cardiology   Contact information:   [address] [place] [address] [phone]        The results of significant diagnostics from this hospitalization (including imaging, microbiology, ancillary and laboratory) are listed below for reference.    Significant Diagnostic Studies: Ct Angio Chest Pe W/cm &/or Wo Cm  07/01/2013    CLINICAL DATA:  Chest pain  EXAM: CT ANGIOGRAPHY CHEST WITH CONTRAST  TECHNIQUE: Multidetector CT imaging of the chest was performed using the standard protocol during bolus administration of intravenous contrast. Multiplanar CT image reconstructions and MIPs were obtained to evaluate the vascular anatomy.  CONTRAST:  136mL OMNIPAQUE IOHEXOL 350 MG/ML SOLN  COMPARISON:  Chest radiograph July 30, 2009  FINDINGS: There is no demonstrable pulmonary embolus. There is no thoracic aortic aneurysm or dissection.  There is centrilobular emphysematous change there is lower lobe bronchiectatic change bilaterally. There is patchy atelectasis in the lung bases bilaterally, more on the left than on the right. There is no frank edema or consolidation.  There are multiple foci of coronary artery calcification. Pericardium is not thickened. There is no demonstrable thoracic adenopathy.  In the visualized upper  abdomen, there is fatty change in the liver. There is no apparent splenic tissue; question previous splenectomy. There is degenerative change in the thoracic spine. There are no blastic or lytic bone lesions. Visualized thyroid appears normal.  Review of the MIP images confirms the above findings.  IMPRESSION: No demonstrable pulmonary embolus. Underlying emphysema with lower lobe bronchiectatic change. Mild bibasilar atelectatic change. Question previous splenectomy. Fatty liver. Multiple foci of coronary artery calcification present.   Electronically Signed   By: Lowella Grip M.D.   On: 07/01/2013 12:53   Nm Myocar Single W/spect W/wall Motion And Ef  07/02/2013   CLINICAL DATA:  63 year old male with a known history of coronary artery disease referred for chest pain.  EXAM: MYOCARDIAL IMAGING WITH SPECT (REST AND EXERCISE)  GATED LEFT VENTRICULAR WALL MOTION STUDY  LEFT VENTRICULAR EJECTION FRACTION  TECHNIQUE: Standard myocardial SPECT imaging was performed after resting intravenous injection of 10 mCi Tc-46m  sestamibi. Subsequently, exercise tolerance test was performed by the patient under the supervision of the Cardiology staff. At peak-stress, 30 mCi Tc-20m sestamibi was injected intravenously and standard myocardial SPECT imaging was performed. Quantitative gated imaging was also performed to evaluate left ventricular wall motion, and estimate left ventricular ejection fraction.  COMPARISON:  None.  FINDINGS: Exercise stress test  The patient was stressed according to the Bruce protocol for 10 min and 11 seconds achieving a work level of 13.4 Mets. The resting heart rate of 68 beats per min increased to a rate of 151 beats per min, representing 97% of the maximal age predicted target heart rate. The resting blood pressure increased from 129/89 up to 181/83. The test was stopped due to fatigue, the patient did not experience any chest pain.  Baseline EKG showed normal sinus rhythm. Stress EKG showed no specific ischemic changes and no significant arrhythmias. There was occasional isolated PVCs and also ventricular by Roselyn Reef, however no significant sustained arrhythmias.  Myocardial perfusion imaging  Raw images showed appropriate radiotracer uptake. There is noted heavy gut uptake in the resting images. There was a large severe intensity inferior wall defect seen in the resting images, this area was much more mild and nearly resolved in the post stress images. The inferior wall had normal wall motion, overall findings are consistent with sub- diaphragmatic attenuation. There are no other myocardial perfusion defects.  Gated imaging showed end-diastolic volume 95 mL, end systolic volume 29 mL, left ventricular ejection fraction 70%, wall motion was normal.  IMPRESSION: 1.  Negative exercise MPI for ischemia  2.  Negative stress EKG for ischemia  3.  Normal left ventricular systolic function, LVEF 10%  4. Duke treadmill score of 10, consistent with low risk for major cardiac events  5. Exceptional exercise functional  capacity (>150% of predicted based on age and gender)   Electronically Signed   By: Carlyle Dolly   On: 07/02/2013 17:12    Microbiology: No results found for this or any previous visit (from the past 240 hour(s)).   Labs: Basic Metabolic Panel:  Recent Labs Lab 07/01/13 1147 07/02/13 0555  NA 140 140  K 4.0 4.1  CL 103 103  CO2 23 23  GLUCOSE 90 107*  BUN 20 17  CREATININE 1.34 1.07  CALCIUM 9.3 8.9   Liver Function Tests:  Recent Labs Lab 07/01/13 1147  AST 28  ALT 27  ALKPHOS 76  BILITOT 0.5  PROT 7.4  ALBUMIN 3.9   No results found for this basename: LIPASE, AMYLASE,  in  the last 168 hours No results found for this basename: AMMONIA,  in the last 168 hours CBC:  Recent Labs Lab 07/01/13 1147 07/02/13 0555  WBC 11.4* 12.4*  NEUTROABS 5.5  --   HGB 15.9 15.9  HCT 46.1 47.0  MCV 91.1 92.0  PLT 489* 507*   Cardiac Enzymes:  Recent Labs Lab 07/01/13 1147 07/01/13 1815 07/01/13 2310 07/02/13 0555  TROPONINI <0.30 <0.30 <0.30 <0.30   BNP: BNP (last 3 results)  Recent Labs  07/01/13 1147  PROBNP 29.9   CBG: No results found for this basename: GLUCAP,  in the last 168 hours     Signed:  MEMON,JEHANZEB  [facility] 07/03/2013, 7:37 PM

## 2013-07-08 ENCOUNTER — Encounter (INDEPENDENT_AMBULATORY_CARE_PROVIDER_SITE_OTHER): Payer: Self-pay

## 2013-07-08 MED ORDER — PEG-KCL-NACL-NASULF-NA ASC-C 100 G PO SOLR: 1.0000 | Freq: Once | ORAL | Status: DC

## 2013-07-15 ENCOUNTER — Encounter (HOSPITAL_COMMUNITY): Payer: Self-pay | Admitting: Pharmacy Technician

## 2013-07-16 ENCOUNTER — Encounter: Payer: Self-pay | Admitting: Adult Health

## 2013-07-16 ENCOUNTER — Ambulatory Visit (INDEPENDENT_AMBULATORY_CARE_PROVIDER_SITE_OTHER): Payer: Medicare Other | Admitting: Adult Health

## 2013-07-16 VITALS — BP 106/74 | HR 64 | Ht 73.0 in | Wt 216.0 lb

## 2013-07-16 DIAGNOSIS — R42 Dizziness and giddiness: Secondary | ICD-10-CM

## 2013-07-16 DIAGNOSIS — R0602 Shortness of breath: Secondary | ICD-10-CM

## 2013-07-16 DIAGNOSIS — I2510 Atherosclerotic heart disease of native coronary artery without angina pectoris: Secondary | ICD-10-CM

## 2013-07-16 DIAGNOSIS — E785 Hyperlipidemia, unspecified: Secondary | ICD-10-CM

## 2013-07-16 DIAGNOSIS — I2584 Coronary atherosclerosis due to calcified coronary lesion: Secondary | ICD-10-CM

## 2013-07-16 NOTE — Patient Instructions (Signed)
Your physician recommends that you schedule a follow-up appointment in: 1 month with Dr Harl Bowie  Your physician has recommended that you wear an event monitor for 1 week. Event monitors are medical devices that record the heart's electrical activity. Doctors most often [place] these monitors to diagnose arrhythmias. Arrhythmias are problems with the speed or rhythm of the heartbeat. The monitor is a small, portable device. You can wear one while you do your normal daily activities. This is usually used to diagnose what is causing palpitations/syncope (passing out).  Your physician recommends that you continue on your current medications as directed. Please refer to the Current Medication list given to you today.  Thank you for choosing [facility]!!

## 2013-07-16 NOTE — Assessment & Plan Note (Signed)
He has no recurrence of chest pain, his main complaint is dizziness, associated shortness of breath, occurring every day with exertion to include walking across his yard. She denies any presyncope. He describes it as a wave that comes over him. I will place a cardiac monitor on the patient for one week to evaluate for cardiac arrhythmias bradycardia or pauses causing this symptom. If this is been ruled out, I would find other etiology for his symptoms to include evaluation by ENT, vs. neurology, at PCP discretion.

## 2013-07-16 NOTE — Progress Notes (Signed)
Name: Samuel Tanner    DOB: 1950/11/12  Age: 63 y.o.  MR#: [id number]       PCP:  Delphina Cahill, MD      Insurance: Payor: [employer] MEDICARE / Plan: AARP MEDICARE COMPLETE / Product Type: *No Product type* /   CC:    Chief Complaint  Patient presents with  . Coronary Artery Disease  . Hyperlipidemia    VS Filed Vitals:   07/16/13 1509  BP: 106/74  Pulse: 64  Height: 6\' 1"  (1.854 m)  Weight: 216 lb (97.977 kg)    Weights Current Weight  07/16/13 216 lb (97.977 kg)  07/01/13 216 lb 6.4 oz (98.158 kg)  05/25/13 222 lb (100.699 kg)    Blood Pressure  BP Readings from Last 3 Encounters:  07/16/13 106/74  07/02/13 130/89  05/25/13 94/62     Admit date:  (Not on file) Last encounter with RMR:  Visit date not found   Allergy Asa and Morphine and related  Current Outpatient Prescriptions  Medication Sig Dispense Refill  . dicyclomine (BENTYL) 10 MG capsule Take 10 mg by mouth daily.      . fish oil-omega-3 fatty acids 1000 MG capsule Take 1 g by mouth daily.       . metoprolol tartrate (LOPRESSOR) 12.5 mg TABS tablet Take 0.5 tablets (12.5 mg total) by mouth 2 (two) times daily.  30 tablet  1  . pantoprazole (PROTONIX) 40 MG tablet Take 1 tablet (40 mg total) by mouth 2 (two) times daily before a meal.  60 tablet  5  . Probiotic Product (PROBIOTIC DAILY PO) Take by mouth.      . rosuvastatin (CRESTOR) 40 MG tablet Take 20 mg by mouth at bedtime.        No current facility-administered medications for this visit.    Discontinued Meds:   There are no discontinued medications.  Patient Active Problem List   Diagnosis Date Noted  . Chest pain 07/01/2013  . CAD (coronary artery disease) 07/01/2013  . GERD (gastroesophageal reflux disease) 04/27/2013  . Short-segment Barrett's esophagus 04/27/2013  . Acute blood loss anemia 03/29/2013  . Leukocytosis, unspecified 03/29/2013  . GI bleed 03/28/2013  . Hyperlipidemia   . Appendiceal carcinoid tumor   . CHEST  PAIN-UNSPECIFIED 01/24/2009  . HYPERLIPIDEMIA-MIXED 07/05/2008    LABS    Component Value Date/Time   NA 140 07/02/2013 0555   NA 140 07/01/2013 1147   NA 144 03/29/2013 0214   K 4.1 07/02/2013 0555   K 4.0 07/01/2013 1147   K 4.4 03/29/2013 0214   CL 103 07/02/2013 0555   CL 103 07/01/2013 1147   CL 109 03/29/2013 0214   CO2 23 07/02/2013 0555   CO2 23 07/01/2013 1147   CO2 25 03/29/2013 0214   GLUCOSE 107* 07/02/2013 0555   GLUCOSE 90 07/01/2013 1147   GLUCOSE 111* 03/29/2013 0214   BUN 17 07/02/2013 0555   BUN 20 07/01/2013 1147   BUN 36* 03/29/2013 0214   CREATININE 1.07 07/02/2013 0555   CREATININE 1.34 07/01/2013 1147   CREATININE 1.15 03/29/2013 0214   CALCIUM 8.9 07/02/2013 0555   CALCIUM 9.3 07/01/2013 1147   CALCIUM 8.7 03/29/2013 0214   GFRNONAA 71* 07/02/2013 0555   GFRNONAA 54* 07/01/2013 1147   GFRNONAA 65* 03/29/2013 0214   GFRAA 82* 07/02/2013 0555   GFRAA 63* 07/01/2013 1147   GFRAA 75* 03/29/2013 0214   CMP     Component Value Date/Time   NA 140  07/02/2013 0555   K 4.1 07/02/2013 0555   CL 103 07/02/2013 0555   CO2 23 07/02/2013 0555   GLUCOSE 107* 07/02/2013 0555   BUN 17 07/02/2013 0555   CREATININE 1.07 07/02/2013 0555   CALCIUM 8.9 07/02/2013 0555   PROT 7.4 07/01/2013 1147   ALBUMIN 3.9 07/01/2013 1147   AST 28 07/01/2013 1147   ALT 27 07/01/2013 1147   ALKPHOS 76 07/01/2013 1147   BILITOT 0.5 07/01/2013 1147   GFRNONAA 71* 07/02/2013 0555   GFRAA 82* 07/02/2013 0555       Component Value Date/Time   WBC 12.4* 07/02/2013 0555   WBC 11.4* 07/01/2013 1147   WBC 11.3* 05/20/2013 0729   HGB 15.9 07/02/2013 0555   HGB 15.9 07/01/2013 1147   HGB 15.1 05/20/2013 0729   HCT 47.0 07/02/2013 0555   HCT 46.1 07/01/2013 1147   HCT 43.6 05/20/2013 0729   MCV 92.0 07/02/2013 0555   MCV 91.1 07/01/2013 1147   MCV 89.5 05/20/2013 0729    Lipid Panel     Component Value Date/Time   CHOL  Value: 109        ATP III CLASSIFICATION:  <200     mg/dL   Desirable  200-239  mg/dL   Borderline High   >=240    mg/dL   High        08/02/2009 0458   TRIG 226* 08/02/2009 0458   HDL 22* 08/02/2009 0458   CHOLHDL 5.0 08/02/2009 0458   VLDL 45* 08/02/2009 0458   LDLCALC  Value: 42        Total Cholesterol/HDL:CHD Risk Coronary Heart Disease Risk Table                     Men   Women  1/2 Average Risk   3.4   3.3  Average Risk       5.0   4.4  2 X Average Risk   9.6   7.1  3 X Average Risk  23.4   11.0        Use the calculated Patient Ratio above and the CHD Risk Table to determine the patient's CHD Risk.        ATP III CLASSIFICATION (LDL):  <100     mg/dL   Optimal  100-129  mg/dL   Near or Above                    Optimal  130-159  mg/dL   Borderline  160-189  mg/dL   High  >190     mg/dL   Very High 08/02/2009 0458    ABG No results found for this basename: phart, pco2, pco2art, po2, po2art, hco3, tco2, acidbasedef, o2sat     Lab Results  Component Value Date   TSH 1.060 07/01/2013   BNP (last 3 results)  Recent Labs  07/01/13 1147  PROBNP 29.9   Cardiac Panel (last 3 results) No results found for this basename: CKTOTAL, CKMB, TROPONINI, RELINDX,  in the last 72 hours  Iron/TIBC/Ferritin/ %Sat    Component Value Date/Time   IRON 102 07/17/2010 0930   TIBC 330 07/17/2010 0930   FERRITIN 98 01/15/2011 0912   IRONPCTSAT 31 07/17/2010 0930     EKG Orders placed during the hospital encounter of 07/01/13  . EKG 12-LEAD  . EKG 12-LEAD  . EKG 12-LEAD  . EKG 12-LEAD  . EKG     Prior Assessment and Plan Problem List as  of 07/16/2013     Cardiovascular and Mediastinum   CAD (coronary artery disease)     Digestive   Appendiceal carcinoid tumor   GI bleed   GERD (gastroesophageal reflux disease)   Short-segment Barrett's esophagus     Other   HYPERLIPIDEMIA-MIXED   CHEST PAIN-UNSPECIFIED   Hyperlipidemia   Acute blood loss anemia   Leukocytosis, unspecified   Chest pain       Imaging: Ct Angio Chest Pe W/cm &/or Wo Cm  07/01/2013   CLINICAL DATA:  Chest pain  EXAM: CT  ANGIOGRAPHY CHEST WITH CONTRAST  TECHNIQUE: Multidetector CT imaging of the chest was performed using the standard protocol during bolus administration of intravenous contrast. Multiplanar CT image reconstructions and MIPs were obtained to evaluate the vascular anatomy.  CONTRAST:  149mL OMNIPAQUE IOHEXOL 350 MG/ML SOLN  COMPARISON:  Chest radiograph July 30, 2009  FINDINGS: There is no demonstrable pulmonary embolus. There is no thoracic aortic aneurysm or dissection.  There is centrilobular emphysematous change there is lower lobe bronchiectatic change bilaterally. There is patchy atelectasis in the lung bases bilaterally, more on the left than on the right. There is no frank edema or consolidation.  There are multiple foci of coronary artery calcification. Pericardium is not thickened. There is no demonstrable thoracic adenopathy.  In the visualized upper abdomen, there is fatty change in the liver. There is no apparent splenic tissue; question previous splenectomy. There is degenerative change in the thoracic spine. There are no blastic or lytic bone lesions. Visualized thyroid appears normal.  Review of the MIP images confirms the above findings.  IMPRESSION: No demonstrable pulmonary embolus. Underlying emphysema with lower lobe bronchiectatic change. Mild bibasilar atelectatic change. Question previous splenectomy. Fatty liver. Multiple foci of coronary artery calcification present.   Electronically Signed   By: Lowella Grip M.D.   On: 07/01/2013 12:53   Nm Myocar Single W/spect W/wall Motion And Ef  07/02/2013   CLINICAL DATA:  63 year old male with a known history of coronary artery disease referred for chest pain.  EXAM: MYOCARDIAL IMAGING WITH SPECT (REST AND EXERCISE)  GATED LEFT VENTRICULAR WALL MOTION STUDY  LEFT VENTRICULAR EJECTION FRACTION  TECHNIQUE: Standard myocardial SPECT imaging was performed after resting intravenous injection of 10 mCi Tc-34m sestamibi. Subsequently, exercise  tolerance test was performed by the patient under the supervision of the Cardiology staff. At peak-stress, 30 mCi Tc-57m sestamibi was injected intravenously and standard myocardial SPECT imaging was performed. Quantitative gated imaging was also performed to evaluate left ventricular wall motion, and estimate left ventricular ejection fraction.  COMPARISON:  None.  FINDINGS: Exercise stress test  The patient was stressed according to the Bruce protocol for 10 min and 11 seconds achieving a work level of 13.4 Mets. The resting heart rate of 68 beats per min increased to a rate of 151 beats per min, representing 97% of the maximal age predicted target heart rate. The resting blood pressure increased from 129/89 up to 181/83. The test was stopped due to fatigue, the patient did not experience any chest pain.  Baseline EKG showed normal sinus rhythm. Stress EKG showed no specific ischemic changes and no significant arrhythmias. There was occasional isolated PVCs and also ventricular by Roselyn Reef, however no significant sustained arrhythmias.  Myocardial perfusion imaging  Raw images showed appropriate radiotracer uptake. There is noted heavy gut uptake in the resting images. There was a large severe intensity inferior wall defect seen in the resting images, this area was much more mild and  nearly resolved in the post stress images. The inferior wall had normal wall motion, overall findings are consistent with sub- diaphragmatic attenuation. There are no other myocardial perfusion defects.  Gated imaging showed end-diastolic volume 95 mL, end systolic volume 29 mL, left ventricular ejection fraction 70%, wall motion was normal.  IMPRESSION: 1.  Negative exercise MPI for ischemia  2.  Negative stress EKG for ischemia  3.  Normal left ventricular systolic function, LVEF 62%  4. Duke treadmill score of 10, consistent with low risk for major cardiac events  5. Exceptional exercise functional capacity (>150% of predicted based  on age and gender)   Electronically Signed   By: Carlyle Dolly   On: 07/02/2013 17:12

## 2013-07-16 NOTE — Progress Notes (Signed)
HPI: [name] is a 63 year old male patient of Dr. branch we follow for ongoing assessment and management of CAD with prior stents to left circumflex, cardiac catheterization 2009 revealing patent stent and overall nonobstructive disease with normal LV function. Other history includes hyperlipidemia, GI bleed followed by GI and found to be related to NSAID use.  Unfortunately the patient was admitted to the hospital with recurrent chest pain. He is to be negative for PE. He underwent a stress test which was found to be negative for ischemia. He was started on Lopressor as antianginal agent and discharge without further chest discomfort. He is here in followup posthospitalization.  He has had no further chest pain, but does complain of some pressure in his chest, shortness of breath, and dizziness associated with minimal exertion. The patient states this happens every day. He denies palpitations racing heart rate or near syncope. He has not been seen by ENT and neurology. He believes he may have sleep apnea, but wishes to clear his heart issues before pursuing further examinations by specialists.  Allergies  Allergen Reactions  . Asa [Aspirin]     PT HAS PEPTIC ULCER  . Morphine And Related     hallucinations    Current Outpatient Prescriptions  Medication Sig Dispense Refill  . dicyclomine (BENTYL) 10 MG capsule Take 10 mg by mouth daily.      . fish oil-omega-3 fatty acids 1000 MG capsule Take 1 g by mouth daily.       . metoprolol tartrate (LOPRESSOR) 12.5 mg TABS tablet Take 0.5 tablets (12.5 mg total) by mouth 2 (two) times daily.  30 tablet  1  . pantoprazole (PROTONIX) 40 MG tablet Take 1 tablet (40 mg total) by mouth 2 (two) times daily before a meal.  60 tablet  5  . Probiotic Product (PROBIOTIC DAILY PO) Take by mouth.      . rosuvastatin (CRESTOR) 40 MG tablet Take 20 mg by mouth at bedtime.        No current facility-administered medications for this visit.    Past Medical  History  Diagnosis Date  . Hyperlipidemia     mixed  . Chest tightness, discomfort, or pressure   . Appendiceal carcinoid tumor     resection  15 yrs ago  . Ulcer   . MI (myocardial infarction)   . Barrett esophagus 03/29/13    Preformed by Dr.Rourk    Past Surgical History  Procedure Laterality Date  .  appendiceal carcinoid       resection 15 yrs sgo  . Cholecystectomy    . Cardiac catheterization      06/27/2007 and 09/17/2001  . Esophagogastroduodenoscopy N/A 03/29/2013    Procedure: ESOPHAGOGASTRODUODENOSCOPY (EGD);  Surgeon: Daneil Dolin, MD;  Location: [facility];  Service: Endoscopy;  Laterality: N/A;  . Upper gastrointestinal endoscopy      03/29/13    ROS: Review of systems complete and found to be negative unless listed above  PHYSICAL EXAM BP 106/74  Pulse 64  Ht 6\' 1"  (1.854 m)  Wt 216 lb (97.977 kg)  BMI 28.50 kg/m2 General: Well developed, well nourished, in no acute distress Head: Eyes PERRLA, No xanthomas.   Normal cephalic and atramatic  Lungs: Clear bilaterally to auscultation and percussion. Heart: HRRR S1 S2, without MRG.  Pulses are 2+ & equal.            No carotid bruit. No JVD.  No abdominal bruits. No femoral bruits. Abdomen: Bowel  sounds are positive, abdomen soft and non-tender without masses or                  Hernia's noted. Msk:  Back normal, normal gait. Normal strength and tone for age. Extremities: No clubbing, cyanosis or edema.  DP +1 Neuro: Alert and oriented X 3. Psych:  Good affect, responds appropriately   ASSESSMENT AND PLAN

## 2013-07-16 NOTE — Assessment & Plan Note (Signed)
He will continue statin regimen. No changes at this time and any other medication regimen.

## 2013-07-22 ENCOUNTER — Encounter (INDEPENDENT_AMBULATORY_CARE_PROVIDER_SITE_OTHER): Payer: Self-pay | Admitting: *Deleted

## 2013-07-27 ENCOUNTER — Encounter: Payer: Self-pay | Admitting: Internal Medicine

## 2013-07-28 ENCOUNTER — Other Ambulatory Visit: Payer: Self-pay | Admitting: *Deleted

## 2013-07-28 DIAGNOSIS — R42 Dizziness and giddiness: Secondary | ICD-10-CM

## 2013-07-28 DIAGNOSIS — R0602 Shortness of breath: Secondary | ICD-10-CM

## 2013-07-29 ENCOUNTER — Telehealth: Payer: Self-pay | Admitting: Cardiology

## 2013-07-29 MED ORDER — NITROGLYCERIN 0.4 MG SL SUBL: 0.4000 mg | SUBLINGUAL_TABLET | SUBLINGUAL | Status: AC | PRN

## 2013-07-29 NOTE — Telephone Encounter (Signed)
Patient is asking about the results of his event monitor / also needs RX for NTG sent to [facility] in [place] / tgs

## 2013-07-29 NOTE — Telephone Encounter (Signed)
Made pt aware of event monitor results.

## 2013-07-30 ENCOUNTER — Encounter (HOSPITAL_COMMUNITY)
Admission: RE | Disposition: A | Discharge status: Home / Self Care | Payer: Self-pay | Source: Ambulatory Visit | Attending: Internal Medicine

## 2013-07-30 ENCOUNTER — Encounter (HOSPITAL_COMMUNITY): Payer: Self-pay | Admitting: *Deleted

## 2013-07-30 ENCOUNTER — Ambulatory Visit (HOSPITAL_COMMUNITY)
Admission: RE | Admit: 2013-07-30 | Discharge: 2013-07-30 | Disposition: A | Discharge status: Home / Self Care | Payer: Medicare Other | Source: Ambulatory Visit | Attending: Internal Medicine | Admitting: Internal Medicine

## 2013-07-30 DIAGNOSIS — Z8042 Family history of malignant neoplasm of prostate: Secondary | ICD-10-CM | POA: Insufficient documentation

## 2013-07-30 DIAGNOSIS — D179 Benign lipomatous neoplasm, unspecified: Secondary | ICD-10-CM | POA: Insufficient documentation

## 2013-07-30 DIAGNOSIS — Z860100 Personal history of colon polyps, unspecified: Secondary | ICD-10-CM

## 2013-07-30 DIAGNOSIS — Z9049 Acquired absence of other specified parts of digestive tract: Secondary | ICD-10-CM | POA: Insufficient documentation

## 2013-07-30 DIAGNOSIS — D175 Benign lipomatous neoplasm of intra-abdominal organs: Secondary | ICD-10-CM

## 2013-07-30 DIAGNOSIS — K645 Perianal venous thrombosis: Secondary | ICD-10-CM

## 2013-07-30 DIAGNOSIS — E785 Hyperlipidemia, unspecified: Secondary | ICD-10-CM | POA: Insufficient documentation

## 2013-07-30 DIAGNOSIS — Z800 Family history of malignant neoplasm of digestive organs: Secondary | ICD-10-CM

## 2013-07-30 DIAGNOSIS — K648 Other hemorrhoids: Secondary | ICD-10-CM | POA: Insufficient documentation

## 2013-07-30 HISTORY — PX: COLONOSCOPY: SHX5424

## 2013-07-30 SURGERY — COLONOSCOPY
Anesthesia: Moderate Sedation

## 2013-07-30 MED ORDER — MEPERIDINE HCL 50 MG/ML IJ SOLN
INTRAMUSCULAR | Status: AC
  Filled 2013-07-30: qty 1

## 2013-07-30 MED ORDER — MEPERIDINE HCL 50 MG/ML IJ SOLN
INTRAMUSCULAR | Status: DC | PRN
  Administered 2013-07-30 (×2): 25 mg via INTRAVENOUS

## 2013-07-30 MED ORDER — STERILE WATER FOR IRRIGATION IR SOLN
Status: DC | PRN
  Administered 2013-07-30: 10:00:00

## 2013-07-30 MED ORDER — MIDAZOLAM HCL 5 MG/5ML IJ SOLN
INTRAMUSCULAR | Status: AC
  Filled 2013-07-30: qty 10

## 2013-07-30 MED ORDER — SODIUM CHLORIDE 0.9 % IV SOLN
INTRAVENOUS | Status: DC
  Administered 2013-07-30: 10:00:00 via INTRAVENOUS

## 2013-07-30 MED ORDER — MIDAZOLAM HCL 5 MG/5ML IJ SOLN
INTRAMUSCULAR | Status: DC | PRN
  Administered 2013-07-30 (×3): 2 mg via INTRAVENOUS

## 2013-07-30 NOTE — H&P (Signed)
Samuel Tanner is an 63 y.o. male.   Chief Complaint: Patient is here for colonoscopy. HPI: Patient is 63 year old Caucasian male who has history of colonic adenomas and family history of colon carcinoma and is here for surveillance colonoscopy. He had tubular adenoma with dysplasia removed in 2002. His last exam was 5 years ago and no polyps were found. Denies abdominal pain change in bowel habits rectal bleeding. History significant for colon carcinoma in his father who is around 61 and left second primary colon primary 17 years later.  Past Medical History  Diagnosis Date  . Hyperlipidemia     mixed  . Chest tightness, discomfort, or pressure   . Appendiceal carcinoid tumor     resection  15 yrs ago  . Ulcer   . MI (myocardial infarction)   . Barrett esophagus 03/29/13    Preformed by Dr.Rourk    Past Surgical History  Procedure Laterality Date  .  appendiceal carcinoid       resection 15 yrs sgo  . Cholecystectomy    . Cardiac catheterization      06/27/2007 and 09/17/2001  . Esophagogastroduodenoscopy N/A 03/29/2013    Procedure: ESOPHAGOGASTRODUODENOSCOPY (EGD);  Surgeon: Daneil Dolin, MD;  Location: [facility];  Service: Endoscopy;  Laterality: N/A;  . Upper gastrointestinal endoscopy      03/29/13  . Colonoscopy      Family History  Problem Relation Age of Onset  . Heart attack Father   . Cancer Father     prostate cancer  . Heart disease Father   . Colon cancer Father    Social History:  reports that he has never smoked. He has never used smokeless tobacco. He reports that he does not drink alcohol or use illicit drugs.  Allergies:  Allergies  Allergen Reactions  . Asa [Aspirin]     PT HAS PEPTIC ULCER  . Morphine And Related     hallucinations    Medications Prior to Admission  Medication Sig Dispense Refill  . dicyclomine (BENTYL) 10 MG capsule Take 10 mg by mouth daily.      . fish oil-omega-3 fatty acids 1000 MG capsule Take 1 g by mouth daily.        . metoprolol tartrate (LOPRESSOR) 12.5 mg TABS tablet Take 0.5 tablets (12.5 mg total) by mouth 2 (two) times daily.  30 tablet  1  . pantoprazole (PROTONIX) 40 MG tablet Take 1 tablet (40 mg total) by mouth 2 (two) times daily before a meal.  60 tablet  5  . Probiotic Product (PROBIOTIC DAILY PO) Take by mouth.      . rosuvastatin (CRESTOR) 40 MG tablet Take 20 mg by mouth at bedtime.       . nitroGLYCERIN (NITROSTAT) 0.4 MG SL tablet Place 1 tablet (0.4 mg total) under the tongue every 5 (five) minutes as needed for chest pain.  25 tablet  4    No results found for this or any previous visit (from the past 48 hour(s)). No results found.  ROS  Blood pressure 120/87, pulse 74, temperature 97.5 F (36.4 C), temperature source Oral, resp. rate 18, height 6\' 1"  (1.854 m), weight 216 lb (97.977 kg), SpO2 94.00%. Physical Exam  Constitutional: He appears well-developed and well-nourished.  HENT:  Mouth/Throat: Oropharynx is clear and moist.  Eyes: Conjunctivae are normal. No scleral icterus.  Neck: No thyromegaly present.  Cardiovascular: Normal rate, regular rhythm and normal heart sounds.   No murmur heard. Respiratory: Effort normal  and breath sounds normal.  GI: Soft. He exhibits no distension and no mass. There is no tenderness.  Multiple scars  Musculoskeletal: He exhibits no edema.  Lymphadenopathy:    He has no cervical adenopathy.  Neurological: He is alert.  Skin: Skin is warm and dry.     Assessment/Plan History of colonic adenomas and family history of colon carcinoma. Surveillance colonoscopy.  SamuelNAJEEB Tanner 07/30/2013, 10:04 AM

## 2013-07-30 NOTE — Discharge Instructions (Signed)
Resume usual medications and diet. No driving for 24 hours. Next colonoscopy in 5 years.  Colonoscopy, Care After Refer to this sheet in the next few weeks. These instructions provide you with information on caring for yourself after your procedure. Your health care provider may also give you more specific instructions. Your treatment has been planned according to current medical practices, but problems sometimes occur. Call your health care provider if you have any problems or questions after your procedure. WHAT TO EXPECT AFTER THE PROCEDURE  After your procedure, it is typical to have the following:  A small amount of blood in your stool.  Moderate amounts of gas and mild abdominal cramping or bloating. HOME CARE INSTRUCTIONS  Do not drive, operate machinery, or sign important documents for 24 hours.  You may shower and resume your regular physical activities, but move at a slower pace for the first 24 hours.  Take frequent rest periods for the first 24 hours.  Walk around or put a warm pack on your abdomen to help reduce abdominal cramping and bloating.  Drink enough fluids to keep your urine clear or pale yellow.  You may resume your normal diet as instructed by your health care provider. Avoid heavy or fried foods that are hard to digest.  Avoid drinking alcohol for 24 hours or as instructed by your health care provider.  Only take over-the-counter or prescription medicines as directed by your health care provider.  If a tissue sample (biopsy) was taken during your procedure:  Do not take aspirin or blood thinners for 7 days, or as instructed by your health care provider.  Do not drink alcohol for 7 days, or as instructed by your health care provider.  Eat soft foods for the first 24 hours. SEEK MEDICAL CARE IF: You have persistent spotting of blood in your stool 2-3 days after the procedure. SEEK IMMEDIATE MEDICAL CARE IF:  You have more than a small spotting of blood  in your stool.  You pass large blood clots in your stool.  Your abdomen is swollen (distended).  You have nausea or vomiting.  You have a fever.  You have increasing abdominal pain that is not relieved with medicine. Document Released: 08/16/2003 Document Revised: 10/22/2012 Document Reviewed: 09/08/2012 [facility] Patient Information 2015 [place], [place]. This information is not intended to replace advice given to you by your health care provider. Make sure you discuss any questions you have with your health care provider.

## 2013-07-30 NOTE — Op Note (Signed)
COLONOSCOPY PROCEDURE REPORT  PATIENT:  Samuel Tanner  MR#:  [id number] Birthdate:  [birth year], 63 y.o., male Endoscopist:  Dr. Rogene Houston, MD Referred By:  Dr. Wende Neighbors, MD  Procedure Date: 07/30/2013  Procedure:   Colonoscopy  Indications: Patient is 63 year old Caucasian male with history of colonic adenomas and family history of colon carcinoma in his father. Patient had tubular adenoma with dysplasia removed in 2002 and no polyps were found on his last exam 5 years ago. Father was diagnosed with colon carcinoma at age 29 and had second primary about 55 years later. Patient's personal history significant for peritoneal myxoma diagnosed about 20 years ago. He had partial colectomy and intraperitoneal chemotherapy at [facility] and has remained permission.  Informed Consent:  The procedure and risks were reviewed with the patient and informed consent was obtained.  Medications:  Demerol 50 mg IV Versed 6 mg IV  Description of procedure:  After a digital rectal exam was performed, that colonoscope was advanced from the anus through the rectum and colon to the area of the cecum, ileocecal valve and appendiceal orifice. The cecum was deeply intubated. These structures were well-seen and photographed for the record. From the level of the cecum and ileocecal valve, the scope was slowly and cautiously withdrawn. The mucosal surfaces were carefully surveyed utilizing scope tip to flexion to facilitate fold flattening as needed. The scope was pulled down into the rectum where a thorough exam including retroflexion was performed.  Findings:   Prep excellent. Small submucosal lipoma noted at 35 cm from the anal margin. Colonic anastomosis is at 22 cm from the anal margin. Normal rectal mucosa. Single small hemorrhoids above the dentate line.   Therapeutic/Diagnostic Maneuvers Performed:   None  Complications:  None  Cecal Withdrawal Time:  11 minutes  Impression:  Examination performed to  cecum. Short colon since he has had partial colectomy or left hemicolectomy. Small submucosal lipoma at 35 cm from the anal margin. No evidence of recurrent polyps. Single small internal hemorrhoid.  Recommendations:  Standard instructions given. Next colonoscopy in 5 years.  REHMAN,NAJEEB U  07/30/2013 10:37 AM  CC: Dr. Delphina Cahill, MD & Dr. Rayne Du ref. provider found

## 2013-08-05 ENCOUNTER — Encounter (HOSPITAL_COMMUNITY): Payer: Self-pay | Admitting: Internal Medicine

## 2013-08-31 ENCOUNTER — Ambulatory Visit (INDEPENDENT_AMBULATORY_CARE_PROVIDER_SITE_OTHER): Payer: Medicare Other | Admitting: Cardiology

## 2013-08-31 ENCOUNTER — Encounter: Payer: Self-pay | Admitting: Cardiology

## 2013-08-31 ENCOUNTER — Encounter (INDEPENDENT_AMBULATORY_CARE_PROVIDER_SITE_OTHER): Payer: Self-pay

## 2013-08-31 VITALS — BP 112/72 | HR 79 | Ht 73.0 in | Wt 218.0 lb

## 2013-08-31 DIAGNOSIS — R404 Transient alteration of awareness: Secondary | ICD-10-CM

## 2013-08-31 DIAGNOSIS — R400 Somnolence: Secondary | ICD-10-CM

## 2013-08-31 DIAGNOSIS — E785 Hyperlipidemia, unspecified: Secondary | ICD-10-CM

## 2013-08-31 DIAGNOSIS — I2510 Atherosclerotic heart disease of native coronary artery without angina pectoris: Secondary | ICD-10-CM

## 2013-08-31 MED ORDER — CLOPIDOGREL BISULFATE 75 MG PO TABS: 75.0000 mg | ORAL_TABLET | Freq: Every day | ORAL | Status: DC

## 2013-08-31 NOTE — Patient Instructions (Signed)
Your physician wants you to follow-up in: 1 year You will receive a reminder letter in the mail two months in advance. If you don't receive a letter, please call our office to schedule the follow-up appointment.    Your physician has recommended you make the following change in your medication:     START Plavix 75 mg once daily   You have been referred to Dr.Ed Luan Pulling for sleep study,expect a call from his office      Thank you for choosing [facility] !

## 2013-08-31 NOTE — Progress Notes (Signed)
Clinical Summary Mr. Warwick is a 63 y.o.male seen today for follow up of the following medical problems.   1. CAD  - prior stent to LCX  - last cath 2009 with patent stent, overall non-obstructive disease. LVEF at that time showed normal LV function.  - from prior notes developed peptic ulcer of ASA 81mg  daily, recent admit with GI bleeding thought secondary to NSAID use (aleve).  - denies any chest pain. Denies any SOB or DOE  - compliant with meds  - recent exercise stress nucler 06/2013, no ischemia with Duke treadmill score of 10. -  Stopped metoprolol 3 weeks ago, felt he did not need anymore because no longer having any chest pain. Dizziness has improved since stopping  2. Hyperlipidemia  - compliant with crestor 20mg  at night  - 06/2013 TC 135 TG 117 HDL 39 LDL 73  3. GI bleed  - recent admit 04/2013, gastric ulcer by EGD likely NSAID related.  - followed by GI  4. Dizziness - monitor 07/2013 with no symptoms,no arrythmias - symptoms have improved since last visit, he is off metoprolol.   5. OSA - + snoring. + daytime somnolence  Past Medical History  Diagnosis Date  . Hyperlipidemia     mixed  . Chest tightness, discomfort, or pressure   . Appendiceal carcinoid tumor     resection  15 yrs ago  . Ulcer   . MI (myocardial infarction)   . Barrett esophagus 03/29/13    Preformed by Dr.Rourk     Allergies  Allergen Reactions  . Asa [Aspirin]     PT HAS PEPTIC ULCER  . Morphine And Related     hallucinations     Current Outpatient Prescriptions  Medication Sig Dispense Refill  . dicyclomine (BENTYL) 10 MG capsule Take 10 mg by mouth daily.      . fish oil-omega-3 fatty acids 1000 MG capsule Take 1 g by mouth daily.       . metoprolol tartrate (LOPRESSOR) 12.5 mg TABS tablet Take 0.5 tablets (12.5 mg total) by mouth 2 (two) times daily.  30 tablet  1  . nitroGLYCERIN (NITROSTAT) 0.4 MG SL tablet Place 1 tablet (0.4 mg total) under the tongue every 5 (five)  minutes as needed for chest pain.  25 tablet  4  . pantoprazole (PROTONIX) 40 MG tablet Take 1 tablet (40 mg total) by mouth 2 (two) times daily before a meal.  60 tablet  5  . Probiotic Product (PROBIOTIC DAILY PO) Take by mouth.      . rosuvastatin (CRESTOR) 40 MG tablet Take 20 mg by mouth at bedtime.        No current facility-administered medications for this visit.     Past Surgical History  Procedure Laterality Date  .  appendiceal carcinoid       resection 15 yrs sgo  . Cholecystectomy    . Cardiac catheterization      06/27/2007 and 09/17/2001  . Esophagogastroduodenoscopy N/A 03/29/2013    Procedure: ESOPHAGOGASTRODUODENOSCOPY (EGD);  Surgeon: Daneil Dolin, MD;  Location: [facility];  Service: Endoscopy;  Laterality: N/A;  . Upper gastrointestinal endoscopy      03/29/13  . Colonoscopy    . Colonoscopy N/A 07/30/2013    Procedure: COLONOSCOPY;  Surgeon: Rogene Houston, MD;  Location: [facility];  Service: Endoscopy;  Laterality: N/A;  1030     Allergies  Allergen Reactions  . Diona Fanti [Aspirin]     PT  HAS PEPTIC ULCER  . Morphine And Related     hallucinations      Family History  Problem Relation Age of Onset  . Heart attack Father   . Cancer Father     prostate cancer  . Heart disease Father   . Colon cancer Father      Social History Mr. Ledford reports that he has never smoked. He has never used smokeless tobacco. Mr. Welliver reports that he does not drink alcohol.   Review of Systems CONSTITUTIONAL: No weight loss, fever, chills, weakness or fatigue.  HEENT: Eyes: No visual loss, blurred vision, double vision or yellow sclerae.No hearing loss, sneezing, congestion, runny nose or sore throat.  SKIN: No rash or itching.  CARDIOVASCULAR:  RESPIRATORY: No shortness of breath, cough or sputum.  GASTROINTESTINAL: No anorexia, nausea, vomiting or diarrhea. No abdominal pain or blood.  GENITOURINARY: No burning on urination, no polyuria NEUROLOGICAL: No  headache, dizziness, syncope, paralysis, ataxia, numbness or tingling in the extremities. No change in bowel or bladder control.  MUSCULOSKELETAL: No muscle, back pain, joint pain or stiffness.  LYMPHATICS: No enlarged nodes. No history of splenectomy.  PSYCHIATRIC: No history of depression or anxiety.  ENDOCRINOLOGIC: No reports of sweating, cold or heat intolerance. No polyuria or polydipsia.  Marland Kitchen   Physical Examination p 79 bp 112/72 Wt 218 lbs BMI 29 Gen: resting comfortably, no acute distress HEENT: no scleral icterus, pupils equal round and reactive, no palptable cervical adenopathy,  CV: RRR, no m/r/g, no JVD, no carotid bruits Resp: Clear to auscultation bilaterally GI: abdomen is soft, non-tender, non-distended, normal bowel sounds, no hepatosplenomegaly MSK: extremities are warm, no edema.  Skin: warm, no rash Neuro:  no focal deficits Psych: appropriate affect   Diagnostic Studies 06/2007 Cath  HEMODYNAMIC DATA:  1. Central aortic pressure was 118/78.  2. Left ventricular pressure 104/9.  3. There was no gradient on pullback across the aortic valve.  ANGIOGRAPHIC DATA:  1. Ventriculography done in the RAO projection reveals vigorous global  systolic function. No segmental abnormalities are identified.  There was a small inferior diverticulum, and this had been noted on  the previous angiographic study.  2. The left main is free of critical disease.  3. The LAD has a clear-cut fairly focal mid stenosis of about 40%. It  does not appear to be high-grade or flow-limiting. The vessel then  goes distally to the apex where there was mild luminal irregularity  after the diagonal. The diagonal itself is a small bifurcating  vessel without critical narrowing.  4. The circumflex provides a large marginal Kimmarie Pascale that has been  stented. There is a perhaps about 20% narrowing in the midportion  of the stent which corresponds to the area seen on intravascular  ultrasound. Distally  the vessel trifurcates. It does not appear  to be flow-limiting.  5. The right coronary artery has mild luminal irregularity but no  significant focal stenosis.  6. Intravascular ultrasound was performed. There was good apposition  throughout. The overall minimum lumen diameter appeared to be  appropriate for the stent being between 3.25 and 3.4 throughout  most of the stent. In the midportion of the stent, there is a very  small filling defect that could represent tissue prolapse. Its  density is that of some plaque, although a small amount of layering  thrombus cannot be excluded.  CONCLUSION:  1. Preserved left ventricular function.  2. Mild disease of the left anterior descending artery.  3. Normal-appearing right  coronary artery.  4. Continued patency of the previously placed stent with a small area  of filling defect possibly representing small area of plaque,  tissue prolapse, and/or thrombus.  DISPOSITION: The patient will be treated medically. We will increase  his Plavix to b.i.d. for about 1 week, then resume his normal amount.  We will need to monitor his symptoms prospectively.   06/2013 MPI  IMPRESSION:  1. Negative exercise MPI for ischemia  2. Negative stress EKG for ischemia  3. Normal left ventricular systolic function, LVEF 86%  4. Duke treadmill score of 10, consistent with low risk for major  cardiac events  5. Exceptional exercise functional capacity (>150% of predicted  based on age and gender)     Assessment and Plan  1. CAD  - no current symptoms  - recent negative low risk exercise MPI - continue risk factor modification and secondary prevention  - start plavix as secondary prevention, he cannot tolerate even low dose NSAIDs from a GI standpoint  2. Hyperlipidemia  - continue current statin, lipids at goal. He is on high dose statin.   3. GI bleed  - history of prior GI bleeds, all seem to be NSAID related including prior episode on ASA 81mg  per  notes  - avoid NSAIDs,   4. OSA? - signs and symptoms concerning for OSA, will refer for sleep evaluation with Dr Luan Pulling  F/u 1 year   Arnoldo Lenis, M.D., F.A.C.C.

## 2013-09-10 ENCOUNTER — Ambulatory Visit: Payer: Medicare Other | Admitting: Cardiology

## 2013-11-03 ENCOUNTER — Other Ambulatory Visit (HOSPITAL_COMMUNITY): Payer: Self-pay | Admitting: Respiratory Therapy

## 2013-11-03 DIAGNOSIS — R0683 Snoring: Secondary | ICD-10-CM

## 2013-11-12 ENCOUNTER — Ambulatory Visit (INDEPENDENT_AMBULATORY_CARE_PROVIDER_SITE_OTHER): Payer: Medicare Other | Admitting: Otolaryngology

## 2013-11-12 DIAGNOSIS — J343 Hypertrophy of nasal turbinates: Secondary | ICD-10-CM

## 2013-11-12 DIAGNOSIS — J342 Deviated nasal septum: Secondary | ICD-10-CM

## 2013-11-12 DIAGNOSIS — J0101 Acute recurrent maxillary sinusitis: Secondary | ICD-10-CM

## 2013-11-12 DIAGNOSIS — J310 Chronic rhinitis: Secondary | ICD-10-CM

## 2013-11-17 ENCOUNTER — Ambulatory Visit (INDEPENDENT_AMBULATORY_CARE_PROVIDER_SITE_OTHER): Payer: Medicare Other | Admitting: Internal Medicine

## 2013-11-17 ENCOUNTER — Encounter (INDEPENDENT_AMBULATORY_CARE_PROVIDER_SITE_OTHER): Payer: Self-pay | Admitting: Internal Medicine

## 2013-11-17 VITALS — BP 122/76 | HR 80 | Temp 97.1°F | Resp 18 | Ht 73.0 in | Wt 217.9 lb

## 2013-11-17 DIAGNOSIS — K2270 Barrett's esophagus without dysplasia: Secondary | ICD-10-CM

## 2013-11-17 DIAGNOSIS — K279 Peptic ulcer, site unspecified, unspecified as acute or chronic, without hemorrhage or perforation: Secondary | ICD-10-CM

## 2013-11-17 DIAGNOSIS — K589 Irritable bowel syndrome without diarrhea: Secondary | ICD-10-CM

## 2013-11-17 DIAGNOSIS — K219 Gastro-esophageal reflux disease without esophagitis: Secondary | ICD-10-CM

## 2013-11-17 MED ORDER — PANTOPRAZOLE SODIUM 40 MG PO TBEC: 40.0000 mg | DELAYED_RELEASE_TABLET | Freq: Every day | ORAL | Status: DC

## 2013-11-17 NOTE — Patient Instructions (Signed)
Notify if you have abdominal pain or tarry stools. Take pantoprazole daily before breakfast and Plavix aor clopidogrel in the evening

## 2013-11-17 NOTE — Progress Notes (Signed)
Presenting complaint;  Follow-up for GERD peptic ulcer disease and diarrhea.  Database;  Patient is 63 year old Caucasian male who has chronic GERD and history of peptic ulcer disease. He was hospitalized in March to see her for upper GI bleed and underwent EGD by Dr. Gala Romney and founf to  have gastric ulcer with stigmata of bleed requiring intervention.he was also found to have 2.5 cm tongue of salmon-colored mucosa and biopsy confirmed Barrett's. PUD was felt to be secondary to NSAID therapy.he was also found to have submucosal duodenal lesion which previously has been evaluated at [facility] and felt to be Erick Blinks gland hypertrophy(EUS with FNA in August 2011 and redo in May 2012 at [facility]). 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 Also has history of colonic polyps and his last colonoscopy was in July this year.  Subjective:  Patient is 63 year old Caucasian male who presents for scheduled visit. He feels well. He denies hot or nausea vomiting dysphagia epigastric pain or melena. He has good appetite and his weight has been stable. He has not had diarrhea since he has been on probiotics.he is presently undergoing antibiotic therapy for sinusitis and is in the care of Dr. Benjamine Mola. He is not having any problems with Augmentin. He is not taking OTC NSAIDs. He was briefly hospitalized in June for chest pain or dizziness. Cardiac evaluation was negative.he was subsequently referred to Dr. Benjamine Mola.   Current Medications: Outpatient Encounter Prescriptions as of 11/17/2013  Medication Sig  . amoxicillin-clavulanate (AUGMENTIN) 875-125 MG per tablet Take 1 tablet by mouth 2 (two) times daily.   . clopidogrel (PLAVIX) 75 MG tablet Take 1 tablet (75 mg total) by mouth daily.  . fluticasone (FLONASE) 50 MCG/ACT nasal spray Place 2 sprays into both nostrils daily.  . nitroGLYCERIN (NITROSTAT) 0.4 MG SL tablet Place 1 tablet (0.4 mg  total) under the tongue every 5 (five) minutes as needed for chest pain.  . pantoprazole (PROTONIX) 40 MG tablet Take 1 tablet (40 mg total) by mouth 2 (two) times daily before a meal.  . predniSONE (STERAPRED UNI-PAK) 10 MG tablet Take 1 tablet by mouth daily.   . Probiotic Product (PROBIOTIC DAILY PO) Take by mouth daily. Per Patient he is taking this twice a day  . rosuvastatin (CRESTOR) 40 MG tablet Take 20 mg by mouth at bedtime.   . [DISCONTINUED] dicyclomine (BENTYL) 10 MG capsule Take 10 mg by mouth daily.  . [DISCONTINUED] metoprolol tartrate (LOPRESSOR) 12.5 mg TABS tablet Take 0.5 tablets (12.5 mg total) by mouth 2 (two) times daily.    Objective: Blood pressure 122/76, pulse 80, temperature 97.1 F (36.2 C), temperature source Oral, resp. rate 18, height 6\' 1"  (1.854 m), weight 217 lb 14.4 oz (98.839 kg). Patient is alert and in no acute distress. Conjunctiva is pink. Sclera is nonicteric Oropharyngeal mucosa is normal. No neck masses or thyromegaly noted. Cardiac exam with regular rhythm normal S1 and S2. No murmur or gallop noted. Lungs are clear to auscultation. Abdomen. Midline scar noted. Abdomen is soft and nontender without organomegaly or masses. No LE edema or clubbing noted.   Assessment:  #1. GERD complicated by short segment Barrett's esophagus. Heartburn is well controlled with therapy. He is on double dose PPI and it may be interacting with Plavix. #2. History of peptic ulcer disease secondary to NSAID use. Patient aware not to take NSAIDs. #3.history of colonic adenomas and family history of colon carcinoma. He just had colonoscopy  in July 2015. #4. 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   Plan:  Decrease pantoprazole to 40 mg by mouth every morning. Take Plavix every evening in order to minimize interaction with PPI. Patient will check with Dr. Delphina Cahill if it is time for him to get pneumococcal vaccine. Patient will  continue probiotic 1 capsule daily. Office visit in 1 year.

## 2013-12-03 ENCOUNTER — Ambulatory Visit (INDEPENDENT_AMBULATORY_CARE_PROVIDER_SITE_OTHER): Payer: Medicare Other | Admitting: Otolaryngology

## 2013-12-03 DIAGNOSIS — J342 Deviated nasal septum: Secondary | ICD-10-CM

## 2013-12-03 DIAGNOSIS — J310 Chronic rhinitis: Secondary | ICD-10-CM

## 2013-12-03 DIAGNOSIS — J343 Hypertrophy of nasal turbinates: Secondary | ICD-10-CM

## 2013-12-03 DIAGNOSIS — J0101 Acute recurrent maxillary sinusitis: Secondary | ICD-10-CM

## 2013-12-09 ENCOUNTER — Other Ambulatory Visit: Payer: Self-pay | Admitting: Otolaryngology

## 2013-12-16 ENCOUNTER — Other Ambulatory Visit: Payer: Self-pay | Admitting: Otolaryngology

## 2013-12-28 ENCOUNTER — Telehealth: Payer: Self-pay | Admitting: *Deleted

## 2013-12-28 NOTE — Telephone Encounter (Signed)
Will forward to Dr. Branch. 

## 2013-12-28 NOTE — Telephone Encounter (Signed)
PT IS SCHEDULED TO HAVE A PROCEDURE ON HIS NOSE AND NEEDS TO COME OFF LIPITOR FOR 5 TO 7 DAY AND NEED CLEARANCE FROM [place] TOP DO SO.  Dr Urbano Heir is doing procedure

## 2013-12-28 NOTE — Telephone Encounter (Signed)
Stopping lipitor for that amount of time is fine.   Zandra Abts MD

## 2013-12-29 NOTE — Telephone Encounter (Signed)
Dr. Urbano Heir wanted clearance on continuing Plavix not Lipitor for procedure. Pt told he did not need to hold plavix.

## 2014-01-04 ENCOUNTER — Encounter (HOSPITAL_BASED_OUTPATIENT_CLINIC_OR_DEPARTMENT_OTHER): Payer: Self-pay | Admitting: *Deleted

## 2014-01-04 NOTE — Progress Notes (Signed)
Pt snores, was to have a sleep test-dr Benjamine Mola had him wait till after surgery-he does not have apnea noted by wife- Stent-cardiac work up 6/15 good Did not need to stop plavix To bring all meds and overnight bag just in case

## 2014-01-04 NOTE — Progress Notes (Signed)
   01/04/14 1719  OBSTRUCTIVE SLEEP APNEA  Have you ever been diagnosed with sleep apnea through a sleep study? No  If yes, do you have and use a CPAP or BPAP machine every night? 0  Do you know the presssure settings on your maching? No  Do you snore loudly (loud enough to be heard through closed doors)?  1  Do you often feel tired, fatigued, or sleepy during the daytime? 0  Has anyone observed you stop breathing during your sleep? 0  Do you have, or are you being treated for high blood pressure? 0  BMI more than 35 kg/m2? 0  Age over 32 years old? 1  Neck circumference greater than 40 cm/16 inches? 1  Gender: 1  Obstructive Sleep Apnea Score 4  Score 4 or greater  Results sent to PCP

## 2014-01-12 ENCOUNTER — Encounter (HOSPITAL_BASED_OUTPATIENT_CLINIC_OR_DEPARTMENT_OTHER): Payer: Self-pay | Admitting: *Deleted

## 2014-01-12 ENCOUNTER — Ambulatory Visit (HOSPITAL_BASED_OUTPATIENT_CLINIC_OR_DEPARTMENT_OTHER): Payer: Medicare Other | Admitting: Anesthesiology

## 2014-01-12 ENCOUNTER — Encounter (HOSPITAL_BASED_OUTPATIENT_CLINIC_OR_DEPARTMENT_OTHER)
Admission: RE | Disposition: A | Discharge status: Home / Self Care | Payer: Self-pay | Source: Ambulatory Visit | Attending: Otolaryngology

## 2014-01-12 ENCOUNTER — Ambulatory Visit (HOSPITAL_BASED_OUTPATIENT_CLINIC_OR_DEPARTMENT_OTHER)
Admission: RE | Admit: 2014-01-12 | Discharge: 2014-01-12 | Disposition: A | Discharge status: Home / Self Care | Payer: Medicare Other | Source: Ambulatory Visit | Attending: Otolaryngology | Admitting: Otolaryngology

## 2014-01-12 DIAGNOSIS — J3489 Other specified disorders of nose and nasal sinuses: Secondary | ICD-10-CM | POA: Insufficient documentation

## 2014-01-12 DIAGNOSIS — J343 Hypertrophy of nasal turbinates: Secondary | ICD-10-CM | POA: Diagnosis not present

## 2014-01-12 DIAGNOSIS — J342 Deviated nasal septum: Secondary | ICD-10-CM | POA: Diagnosis not present

## 2014-01-12 HISTORY — PX: NASAL SEPTOPLASTY W/ TURBINOPLASTY: SHX2070

## 2014-01-12 LAB — POCT HEMOGLOBIN-HEMACUE: HEMOGLOBIN: 15.8 g/dL (ref 13.0–17.0)

## 2014-01-12 SURGERY — SEPTOPLASTY, NOSE, WITH NASAL TURBINATE REDUCTION
Anesthesia: General | Site: Nose | Laterality: Bilateral

## 2014-01-12 MED ORDER — MIDAZOLAM HCL 5 MG/5ML IJ SOLN
INTRAMUSCULAR | Status: DC | PRN
  Administered 2014-01-12: 2 mg via INTRAVENOUS

## 2014-01-12 MED ORDER — EPHEDRINE SULFATE 50 MG/ML IJ SOLN
INTRAMUSCULAR | Status: DC | PRN
  Administered 2014-01-12 (×3): 5 mg via INTRAVENOUS

## 2014-01-12 MED ORDER — ONDANSETRON HCL 4 MG/2ML IJ SOLN
INTRAMUSCULAR | Status: DC | PRN
  Administered 2014-01-12: 4 mg via INTRAVENOUS

## 2014-01-12 MED ORDER — FENTANYL CITRATE 0.05 MG/ML IJ SOLN
INTRAMUSCULAR | Status: DC | PRN
  Administered 2014-01-12: 50 ug via INTRAVENOUS
  Administered 2014-01-12: 100 ug via INTRAVENOUS

## 2014-01-12 MED ORDER — LIDOCAINE-EPINEPHRINE 1 %-1:100000 IJ SOLN
INTRAMUSCULAR | Status: DC | PRN
  Administered 2014-01-12: 3 mL

## 2014-01-12 MED ORDER — MIDAZOLAM HCL 2 MG/2ML IJ SOLN: 1.0000 mg | INTRAMUSCULAR | Status: DC | PRN

## 2014-01-12 MED ORDER — PROPOFOL 10 MG/ML IV BOLUS
INTRAVENOUS | Status: DC | PRN
  Administered 2014-01-12: 200 mg via INTRAVENOUS

## 2014-01-12 MED ORDER — OXYMETAZOLINE HCL 0.05 % NA SOLN
NASAL | Status: DC | PRN
  Administered 2014-01-12: 1 via NASAL

## 2014-01-12 MED ORDER — MIDAZOLAM HCL 2 MG/2ML IJ SOLN
INTRAMUSCULAR | Status: AC
  Filled 2014-01-12: qty 2

## 2014-01-12 MED ORDER — DEXAMETHASONE SODIUM PHOSPHATE 4 MG/ML IJ SOLN
INTRAMUSCULAR | Status: DC | PRN
  Administered 2014-01-12: 10 mg via INTRAVENOUS

## 2014-01-12 MED ORDER — ONDANSETRON HCL 4 MG/2ML IJ SOLN: 4.0000 mg | Freq: Once | INTRAMUSCULAR | Status: DC | PRN

## 2014-01-12 MED ORDER — HYDROMORPHONE HCL 1 MG/ML IJ SOLN
INTRAMUSCULAR | Status: AC
  Filled 2014-01-12: qty 1

## 2014-01-12 MED ORDER — SUCCINYLCHOLINE CHLORIDE 20 MG/ML IJ SOLN
INTRAMUSCULAR | Status: DC | PRN
  Administered 2014-01-12: 100 mg via INTRAVENOUS

## 2014-01-12 MED ORDER — HYDROMORPHONE HCL 1 MG/ML IJ SOLN
0.2500 mg | INTRAMUSCULAR | Status: DC | PRN
  Administered 2014-01-12: 0.5 mg via INTRAVENOUS

## 2014-01-12 MED ORDER — LIDOCAINE HCL (CARDIAC) 20 MG/ML IV SOLN
INTRAVENOUS | Status: DC | PRN
  Administered 2014-01-12: 50 mg via INTRAVENOUS

## 2014-01-12 MED ORDER — OXYCODONE-ACETAMINOPHEN 5-325 MG PO TABS: 1.0000 | ORAL_TABLET | ORAL | Status: DC | PRN

## 2014-01-12 MED ORDER — OXYCODONE HCL 5 MG PO TABS
ORAL_TABLET | ORAL | Status: AC
  Filled 2014-01-12: qty 1

## 2014-01-12 MED ORDER — FENTANYL CITRATE 0.05 MG/ML IJ SOLN: 50.0000 ug | INTRAMUSCULAR | Status: DC | PRN

## 2014-01-12 MED ORDER — CEFAZOLIN SODIUM-DEXTROSE 2-3 GM-% IV SOLR
INTRAVENOUS | Status: AC
  Filled 2014-01-12: qty 50

## 2014-01-12 MED ORDER — AMOXICILLIN 875 MG PO TABS: 875.0000 mg | ORAL_TABLET | Freq: Two times a day (BID) | ORAL | Status: DC

## 2014-01-12 MED ORDER — MUPIROCIN 2 % EX OINT
TOPICAL_OINTMENT | CUTANEOUS | Status: DC | PRN
  Administered 2014-01-12: 1 via NASAL

## 2014-01-12 MED ORDER — FENTANYL CITRATE 0.05 MG/ML IJ SOLN
INTRAMUSCULAR | Status: AC
  Filled 2014-01-12: qty 4

## 2014-01-12 MED ORDER — OXYCODONE HCL 5 MG/5ML PO SOLN: 5.0000 mg | Freq: Once | ORAL | Status: AC | PRN

## 2014-01-12 MED ORDER — CEFAZOLIN SODIUM-DEXTROSE 2-3 GM-% IV SOLR
INTRAVENOUS | Status: DC | PRN
  Administered 2014-01-12: 2 g via INTRAVENOUS

## 2014-01-12 MED ORDER — LACTATED RINGERS IV SOLN
INTRAVENOUS | Status: DC
Start: 2014-01-12 — End: 2014-01-12
  Administered 2014-01-12 (×2): via INTRAVENOUS

## 2014-01-12 MED ORDER — OXYCODONE HCL 5 MG PO TABS
5.0000 mg | ORAL_TABLET | Freq: Once | ORAL | Status: AC | PRN
  Administered 2014-01-12: 5 mg via ORAL

## 2014-01-12 SURGICAL SUPPLY — 31 items
ATTRACTOMAT 16X20 MAGNETIC DRP (DRAPES) IMPLANT
CANISTER SUCT 1200ML W/VALVE (MISCELLANEOUS) ×3 IMPLANT
COAGULATOR SUCT 8FR VV (MISCELLANEOUS) ×3 IMPLANT
DECANTER SPIKE VIAL GLASS SM (MISCELLANEOUS) IMPLANT
DRSG NASOPORE 8CM (GAUZE/BANDAGES/DRESSINGS) IMPLANT
DRSG TELFA 3X8 NADH (GAUZE/BANDAGES/DRESSINGS) ×3 IMPLANT
ELECT REM PT RETURN 9FT ADLT (ELECTROSURGICAL) ×3
ELECTRODE REM PT RTRN 9FT ADLT (ELECTROSURGICAL) ×1 IMPLANT
GLOVE BIO SURGEON STRL SZ7.5 (GLOVE) ×3 IMPLANT
GLOVE SURG SS PI 7.0 STRL IVOR (GLOVE) ×3 IMPLANT
GOWN STRL REUS W/ TWL LRG LVL3 (GOWN DISPOSABLE) ×2 IMPLANT
GOWN STRL REUS W/TWL LRG LVL3 (GOWN DISPOSABLE) ×4
NEEDLE HYPO 25X1 1.5 SAFETY (NEEDLE) ×3 IMPLANT
NS IRRIG 1000ML POUR BTL (IV SOLUTION) ×3 IMPLANT
PACK BASIN DAY SURGERY FS (CUSTOM PROCEDURE TRAY) ×3 IMPLANT
PACK ENT DAY SURGERY (CUSTOM PROCEDURE TRAY) ×3 IMPLANT
SLEEVE SCD COMPRESS KNEE MED (MISCELLANEOUS) ×3 IMPLANT
SOLUTION BUTLER CLEAR DIP (MISCELLANEOUS) ×3 IMPLANT
SPLINT NASAL AIRWAY SILICONE (MISCELLANEOUS) IMPLANT
SPONGE GAUZE 2X2 8PLY STER LF (GAUZE/BANDAGES/DRESSINGS) ×1
SPONGE GAUZE 2X2 8PLY STRL LF (GAUZE/BANDAGES/DRESSINGS) ×2 IMPLANT
SPONGE NEURO XRAY DETECT 1X3 (DISPOSABLE) ×3 IMPLANT
SUT CHROMIC 4 0 P 3 18 (SUTURE) ×3 IMPLANT
SUT PLAIN 4 0 ~~LOC~~ 1 (SUTURE) ×3 IMPLANT
SUT PROLENE 3 0 PS 2 (SUTURE) ×3 IMPLANT
SUT VIC AB 4-0 P-3 18XBRD (SUTURE) IMPLANT
SUT VIC AB 4-0 P3 18 (SUTURE)
TOWEL OR 17X24 6PK STRL BLUE (TOWEL DISPOSABLE) ×3 IMPLANT
TUBE SALEM SUMP 12R W/ARV (TUBING) IMPLANT
TUBE SALEM SUMP 16 FR W/ARV (TUBING) ×3 IMPLANT
YANKAUER SUCT BULB TIP NO VENT (SUCTIONS) ×3 IMPLANT

## 2014-01-12 NOTE — Discharge Instructions (Addendum)

## 2014-01-12 NOTE — Op Note (Signed)
DATE OF PROCEDURE: 01/12/2014  OPERATIVE REPORT   SURGEON: Leta Baptist, MD   PREOPERATIVE DIAGNOSES:  1. Severe nasal septal deviation.  2. Bilateral inferior turbinate hypertrophy.  3. Chronic nasal obstruction.  POSTOPERATIVE DIAGNOSES:  1. Severe nasal septal deviation.  2. Bilateral inferior turbinate hypertrophy.  3. Chronic nasal obstruction.  PROCEDURE PERFORMED:  1. Septoplasty.  2. Bilateral partial inferior turbinate resection.   ANESTHESIA: General endotracheal tube anesthesia.   COMPLICATIONS: None.   ESTIMATED BLOOD LOSS: Less than 50 mL.   INDICATION FOR PROCEDURE: Samuel Tanner is a 63 y.o. male with a history of chronic nasal obstruction. The patient was treated with antihistamine, decongestant, steroid nasal spray, and systemic steroids. However, the patient continues to be symptomatic. On examination, the patient was noted to have bilateral severe inferior turbinate hypertrophy and significant nasal septal deviation, causing significant nasal obstruction. Based on the above findings, the decision was made for the patient to undergo the above-stated procedure. The risks, benefits, alternatives, and details of the procedure were discussed with the patient. Questions were invited and answered. Informed consent was obtained.   DESCRIPTION OF PROCEDURE: The patient was taken to the operating room and placed supine on the operating table. General endotracheal tube anesthesia was administered by the anesthesiologist. The patient was positioned, and prepped and draped in the standard fashion for nasal surgery. Pledgets soaked with Afrin were placed in both nasal cavities for decongestion. The pledgets were subsequently removed. The above mentioned severe septal deviation was again noted. 1% lidocaine with 1:100,000 epinephrine was injected onto the nasal septum bilaterally. A hemitransfixion incision was made on the left side. The mucosal flap was carefully elevated on the left  side. A cartilaginous incision was made 1 cm superior to the caudal margin of the nasal septum. Mucosal flap was also elevated on the right side in the similar fashion. It should be noted that due to the severe septal deviation, the deviated portion of the cartilaginous and bony septum had to be removed in piecemeal fashion. Once the deviated portions were removed, a straight midline septum was achieved. The septum was then quilted with 4-0 plain gut sutures. The hemitransfixion incision was closed with interrupted 4-0 chromic sutures. Doyle splints were applied.   Prior to the [facility] splint application, the inferior one half of both hypertrophied inferior turbinate was crossclamped with a Kelly clamp. The inferior one half of each inferior turbinate was then resected with a pair of cross cutting scissors. Hemostasis was achieved with a suction cautery device.   The care of the patient was turned over to the anesthesiologist. The patient was awakened from anesthesia without difficulty. The patient was extubated and transferred to the recovery room in good condition.   OPERATIVE FINDINGS: Severe nasal septal deviation and bilateral inferior turbinate hypertrophy.   SPECIMEN: None.   FOLLOWUP CARE: The patient be discharged home once he is awake and alert. The patient will be placed on Percocet 1-2 tablets p.o. q.6 hours p.r.n. pain, and amoxicillin 875 mg p.o. b.i.d. for 5 days. The patient will follow up in my office in approximately 1 week for splint removal.   Samuel Creswell Raynelle Bring, MD

## 2014-01-12 NOTE — Anesthesia Procedure Notes (Signed)
Procedure Name: Intubation Date/Time: 01/12/2014 10:17 AM Performed by: Lieutenant Diego Pre-anesthesia Checklist: Patient identified, Emergency Drugs available, Suction available and Patient being monitored Patient Re-evaluated:Patient Re-evaluated prior to inductionOxygen Delivery Method: Circle System Utilized Preoxygenation: Pre-oxygenation with 100% oxygen Intubation Type: IV induction Ventilation: Mask ventilation without difficulty Laryngoscope Size: Miller and 2 Grade View: Grade I Tube type: Oral Number of attempts: 1 Airway Equipment and Method: stylet and oral airway Placement Confirmation: ETT inserted through vocal cords under direct vision,  positive ETCO2 and breath sounds checked- equal and bilateral Secured at: 24 (lip) cm Tube secured with: Tape Dental Injury: Teeth and Oropharynx as per pre-operative assessment

## 2014-01-12 NOTE — Transfer of Care (Signed)
Immediate Anesthesia Transfer of Care Note  Patient: Samuel Tanner  Procedure(s) Performed: Procedure(s): BILATERAL TURBINATE RESECTION/SEPTOPLASTY  (Bilateral)  Patient Location: PACU  Anesthesia Type:General  Level of Consciousness: awake  Airway & Oxygen Therapy: Patient Spontanous Breathing and Patient connected to face mask oxygen  Post-op Assessment: Report given to PACU RN and Post -op Vital signs reviewed and stable  Post vital signs: Reviewed and stable  Complications: No apparent anesthesia complications

## 2014-01-12 NOTE — Anesthesia Postprocedure Evaluation (Signed)
  Anesthesia Post-op Note  Patient: Samuel Tanner  Procedure(s) Performed: Procedure(s): BILATERAL TURBINATE RESECTION/SEPTOPLASTY  (Bilateral)  Patient Location: PACU  Anesthesia Type: No value filed.   Level of Consciousness: awake, alert  and oriented  Airway and Oxygen Therapy: Patient Spontanous Breathing  Post-op Pain: mild  Post-op Assessment: Post-op Vital signs reviewed  Post-op Vital Signs: Reviewed  Last Vitals:  Filed Vitals:   01/12/14 1300  BP: 130/79  Pulse: 74  Temp: 36.7 C  Resp: 16    Complications: No apparent anesthesia complications

## 2014-01-12 NOTE — H&P (Signed)
Cc: Chronic nasal obstruction  HPI: The patient is a 63 year old male who returns today for his follow-up evaluation. The patient was last seen 3 weeks ago. At that time, he was noted to have chronic rhinitis with purulent drainage and acute sinusitis.  He was also noted to have significant nasal mucosal congestion, septal deviation and bilateral inferior turbinate hypertrophy. The patient was treated with Augmentin, prednisone and Flonase nasal spray.  The patient returns reporting significant improvement in his facial pain and pressure.  He continues to have significant nasal obstruction.  He has completed his antibiotic and prednisone treatment without difficulty.  He continues to use Flonase daily.  Exam: The nasal cavities were decongested and anesthetised with a combination of oxymetazoline and 4% lidocaine solution.  The flexible scope was inserted into the right nasal cavity.  Endoscopy of the inferior and middle meatus was performed.  Edematous mucosa was noted.  No polyp, mass, or lesion was appreciated.  Bidirectional nasal septal deviation. Olfactory cleft was clear.  Nasopharynx was clear.  Turbinates were hypertrophied but without mass.  Incomplete response to decongestion.  The procedure was repeated on the contralateral side with similar findings.  The patient tolerated the procedure well.  Instructions were given to avoid eating or drinking for 2 hours.    Assessment: 1.  The patient continues to have significant nasal obstruction, secondary to bidirectional nasal septal deviation and bilateral inferior turbinate hypertrophy.  More than 95% of his nasal passageway is obstructed bilaterally.   2.  No purulent drainage or nasal polyposis is noted today.  His previous noted acute sinusitis has resolved.   Plan: 1.  The nasal endoscopy findings are reviewed with the patient.  2.  Based on the above findings, he is a candidate to undergo septoplasty and bilateral turbinate reduction  procedure.   3.  The patient would like to proceed with the surgery.

## 2014-01-12 NOTE — Anesthesia Preprocedure Evaluation (Signed)
Anesthesia Evaluation    Reviewed: Allergy & Precautions, H&P , Patient's Chart, lab work & pertinent test results, reviewed documented beta blocker date and time   Airway        Dental   Pulmonary          Cardiovascular     Neuro/Psych    GI/Hepatic   Endo/Other    Renal/GU      Musculoskeletal   Abdominal   Peds  Hematology   Anesthesia Other Findings   Reproductive/Obstetrics                             Anesthesia Physical Anesthesia Plan  ASA: III  Anesthesia Plan: General ETT   Post-op Pain Management:    Induction:   Airway Management Planned:   Additional Equipment:   Intra-op Plan:   Post-operative Plan:   Informed Consent: I have reviewed the patients History and Physical, chart, labs and discussed the procedure including the risks, benefits and alternatives for the proposed anesthesia with the patient or authorized representative who has indicated his/her understanding and acceptance.   Dental Advisory Given  Plan Discussed with: CRNA and Anesthesiologist  Anesthesia Plan Comments:         Anesthesia Quick Evaluation

## 2014-01-13 ENCOUNTER — Encounter (HOSPITAL_BASED_OUTPATIENT_CLINIC_OR_DEPARTMENT_OTHER): Payer: Self-pay | Admitting: Otolaryngology

## 2014-02-04 ENCOUNTER — Ambulatory Visit (INDEPENDENT_AMBULATORY_CARE_PROVIDER_SITE_OTHER): Payer: Self-pay | Admitting: Otolaryngology

## 2014-03-23 ENCOUNTER — Other Ambulatory Visit (HOSPITAL_COMMUNITY): Payer: Self-pay | Admitting: Oncology

## 2014-03-23 DIAGNOSIS — J321 Chronic frontal sinusitis: Secondary | ICD-10-CM

## 2014-03-30 ENCOUNTER — Ambulatory Visit (HOSPITAL_COMMUNITY)
Admission: RE | Admit: 2014-03-30 | Discharge: 2014-03-30 | Disposition: A | Discharge status: Home / Self Care | Payer: Medicare Other | Source: Ambulatory Visit | Attending: Oncology | Admitting: Oncology

## 2014-03-30 DIAGNOSIS — J321 Chronic frontal sinusitis: Secondary | ICD-10-CM | POA: Diagnosis present

## 2014-05-03 ENCOUNTER — Other Ambulatory Visit: Payer: Self-pay | Admitting: Cardiology

## 2014-05-03 MED ORDER — ROSUVASTATIN CALCIUM 40 MG PO TABS: 20.0000 mg | ORAL_TABLET | Freq: Every day | ORAL | Status: DC

## 2014-05-03 NOTE — Telephone Encounter (Signed)
Refill complete crestor 20 mg hs

## 2014-05-03 NOTE — Telephone Encounter (Signed)
Received fax refill request  Rx # [id number] Medication:  [id number] 40MG  TAB Qty 90 Sig:  TAKE ONE TABLET BY MOUTH ONCE DAILY Physician:  Carlyle Dolly

## 2014-07-27 ENCOUNTER — Encounter (INDEPENDENT_AMBULATORY_CARE_PROVIDER_SITE_OTHER): Payer: Self-pay | Admitting: *Deleted

## 2014-09-24 ENCOUNTER — Other Ambulatory Visit: Payer: Self-pay | Admitting: Cardiology

## 2014-09-24 NOTE — Telephone Encounter (Signed)
-----   Message from Arnoldo Lenis, MD sent at 09/24/2014 11:15 AM EDT ----- Regarding: RE: Is pt to be on Plavix From my last note 08/2013 he was to be on plavix 75mg  daily for secondary prevention. He had developed ulcers on low dose aspirin before and thus we decided on plavix.    Zandra Abts MD ----- Message -----    From: Bernita Raisin, RN    Sent: 09/24/2014  10:48 AM      To: Arnoldo Lenis, MD Subject: Is pt to be on Plavix                          i dont see where he is on plavix anymore,should he be

## 2014-09-24 NOTE — Telephone Encounter (Signed)
Refilled RX

## 2014-10-06 ENCOUNTER — Ambulatory Visit (INDEPENDENT_AMBULATORY_CARE_PROVIDER_SITE_OTHER): Payer: Medicare Other | Admitting: Cardiology

## 2014-10-06 ENCOUNTER — Encounter: Payer: Self-pay | Admitting: Cardiology

## 2014-10-06 VITALS — BP 118/72 | HR 71 | Ht 73.0 in | Wt 215.0 lb

## 2014-10-06 DIAGNOSIS — I2510 Atherosclerotic heart disease of native coronary artery without angina pectoris: Secondary | ICD-10-CM | POA: Diagnosis not present

## 2014-10-06 DIAGNOSIS — E785 Hyperlipidemia, unspecified: Secondary | ICD-10-CM | POA: Diagnosis not present

## 2014-10-06 NOTE — Patient Instructions (Signed)
Your physician wants you to follow-up in: 1 year with DrBranch You will receive a reminder letter in the mail two months in advance. If you don't receive a letter, please call our office to schedule the follow-up appointment.     Your physician recommends that you continue on your current medications as directed. Please refer to the Current Medication list given to you today.      Thank you for choosing [facility] !        

## 2014-10-06 NOTE — Progress Notes (Signed)
Patient ID: Samuel Tanner, male   DOB: 08-16-1950, 64 y.o.   MRN: [id number]     Clinical Summary Samuel Tanner is a 64 y.o.male seen today for follow up of the following medical problems.   1. CAD  - prior stent to LCX  - last cath 2009 with patent stent, overall non-obstructive disease. LVEF at that time showed normal LV function.  - from prior notes developed peptic ulcer of ASA 81mg  daily, previous admit with GI bleeding thought secondary to NSAID use (aleve).  - recent exercise stress nucler 06/2013, no ischemia with Duke treadmill score of 10.  - denies any chest pain. No SOB or DOE - compliant with meds. No bleeding on plavix.     2. Hyperlipidemia  - compliant with crestor 20mg  at night  - 06/2013 TC 135 TG 117 HDL 39 LDL 73 - has labs upcoming with pcp  3. GI bleed  -  admit 04/2013, gastric ulcer by EGD likely NSAID related.  - followed by GI. Has been off ASA.  4. Dizziness - monitor 07/2013 with no symptoms,no arrythmias - symptoms improved off metoprolol.    5. OSA - + snoring. + daytime somnolence - has not been intersted in sleep study   Past Medical History  Diagnosis Date  . Hyperlipidemia     mixed  . Chest tightness, discomfort, or pressure   . Appendiceal carcinoid tumor     resection  15 yrs ago  . Ulcer   . MI (myocardial infarction)   . Barrett esophagus 03/29/13    Preformed by Samuel Tanner     Allergies  Allergen Reactions  . Asa [Aspirin]     PT HAS PEPTIC ULCER  . Morphine And Related     hallucinations     Current Outpatient Prescriptions  Medication Sig Dispense Refill  . amoxicillin (AMOXIL) 875 MG tablet Take 1 tablet (875 mg total) by mouth 2 (two) times daily. 10 tablet 0  . clopidogrel (PLAVIX) 75 MG tablet TAKE ONE TABLET BY MOUTH ONCE DAILY 90 tablet 1  . nitroGLYCERIN (NITROSTAT) 0.4 MG SL tablet Place 1 tablet (0.4 mg total) under the tongue every 5 (five) minutes as needed for chest pain. 25 tablet 4  .  oxyCODONE-acetaminophen (ROXICET) 5-325 MG per tablet Take 1 tablet by mouth every 4 (four) hours as needed for moderate pain or severe pain. 30 tablet 0  . pantoprazole (PROTONIX) 40 MG tablet Take 1 tablet (40 mg total) by mouth daily before breakfast. 30 tablet 11  . Probiotic Product (PROBIOTIC DAILY PO) Take by mouth daily. Per Patient he is taking this twice a day    . rosuvastatin (CRESTOR) 40 MG tablet Take 0.5 tablets (20 mg total) by mouth at bedtime. 45 tablet 3   No current facility-administered medications for this visit.     Past Surgical History  Procedure Laterality Date  .  appendiceal carcinoid   1996  . Cardiac catheterization      06/27/2007 and 09/17/2001  . Esophagogastroduodenoscopy N/A 03/29/2013    Procedure: ESOPHAGOGASTRODUODENOSCOPY (EGD);  Surgeon: Samuel Dolin, MD;  [facility];  Service: Endoscopy;  Laterality: N/A;  . Upper gastrointestinal endoscopy      03/29/13  . Colonoscopy    . Colonoscopy N/A 07/30/2013    Procedure: COLONOSCOPY;  Surgeon: Samuel Houston, MD;  [facility];  Service: Endoscopy;  Laterality: N/A;  1030  . Splenectomy  1996    along with colon resection  .  Appendectomy  1996  . Tonsillectomy    . Hernia repair    . Cholecystectomy  1998  . Nasal septoplasty w/ turbinoplasty Bilateral 01/12/2014    Procedure: BILATERAL TURBINATE RESECTION/SEPTOPLASTY ;  Surgeon: Samuel Dike, MD;  Location: [place];  Service: ENT;  Laterality: Bilateral;     Allergies  Allergen Reactions  . Asa [Aspirin]     PT HAS PEPTIC ULCER  . Morphine And Related     hallucinations      Family History  Problem Relation Age of Onset  . Heart attack Father   . Cancer Father     prostate cancer  . Heart disease Father   . Colon cancer Father      Social History [name] reports that he has never smoked. He has never used smokeless tobacco. [name] reports that he does not drink alcohol.   Review of  Systems CONSTITUTIONAL: No weight loss, fever, chills, weakness or fatigue.  HEENT: Eyes: No visual loss, blurred vision, double vision or yellow sclerae.No hearing loss, sneezing, congestion, runny nose or sore throat.  SKIN: No rash or itching.  CARDIOVASCULAR: per HPI RESPIRATORY: No shortness of breath, cough or sputum.  GASTROINTESTINAL: No anorexia, nausea, vomiting or diarrhea. No abdominal pain or blood.  GENITOURINARY: No burning on urination, no polyuria NEUROLOGICAL: No headache, dizziness, syncope, paralysis, ataxia, numbness or tingling in the extremities. No change in bowel or bladder control.  MUSCULOSKELETAL: No muscle, back pain, joint pain or stiffness.  LYMPHATICS: No enlarged nodes. No history of splenectomy.  PSYCHIATRIC: No history of depression or anxiety.  ENDOCRINOLOGIC: No reports of sweating, cold or heat intolerance. No polyuria or polydipsia.  Marland Kitchen   Physical Examination Filed Vitals:   10/06/14 0823  BP: 118/72  Pulse: 71   Filed Vitals:   10/06/14 0823  Height: 6\' 1"  (1.854 m)  Weight: 215 lb (97.523 kg)    Gen: resting comfortably, no acute distress HEENT: no scleral icterus, pupils equal round and reactive, no palptable cervical adenopathy,  CV: RRR, no m/r/g, no jvd Resp: Clear to auscultation bilaterally GI: abdomen is soft, non-tender, non-distended, normal bowel sounds, no hepatosplenomegaly MSK: extremities are warm, no edema.  Skin: warm, no rash Neuro:  no focal deficits Psych: appropriate affect   Diagnostic Studies 06/2007 Cath  HEMODYNAMIC DATA:  1. Central aortic pressure was 118/78.  2. Left ventricular pressure 104/9.  3. There was no gradient on pullback across the aortic valve.  ANGIOGRAPHIC DATA:  1. Ventriculography done in the RAO projection reveals vigorous global  systolic function. No segmental abnormalities are identified.  There was a small inferior diverticulum, and this had been noted on  the previous  angiographic study.  2. The left main is free of critical disease.  3. The LAD has a clear-cut fairly focal mid stenosis of about 40%. It  does not appear to be high-grade or flow-limiting. The vessel then  goes distally to the apex where there was mild luminal irregularity  after the diagonal. The diagonal itself is a small bifurcating  vessel without critical narrowing.  4. The circumflex provides a large marginal branch that has been  stented. There is a perhaps about 20% narrowing in the midportion  of the stent which corresponds to the area seen on intravascular  ultrasound. Distally the vessel trifurcates. It does not appear  to be flow-limiting.  5. The right coronary artery has mild luminal irregularity but no  significant focal stenosis.  6.  Intravascular ultrasound was performed. There was good apposition  throughout. The overall minimum lumen diameter appeared to be  appropriate for the stent being between 3.25 and 3.4 throughout  most of the stent. In the midportion of the stent, there is a very  small filling defect that could represent tissue prolapse. Its  density is that of some plaque, although a small amount of layering  thrombus cannot be excluded.  CONCLUSION:  1. Preserved left ventricular function.  2. Mild disease of the left anterior descending artery.  3. Normal-appearing right coronary artery.  4. Continued patency of the previously placed stent with a small area  of filling defect possibly representing small area of plaque,  tissue prolapse, and/or thrombus.  DISPOSITION: The patient will be treated medically. We will increase  his Plavix to b.i.d. for about 1 week, then resume his normal amount.  We will need to monitor his symptoms prospectively.   06/2013 MPI  IMPRESSION:  1. Negative exercise MPI for ischemia  2. Negative stress EKG for ischemia  3. Normal left ventricular systolic function, LVEF 14%  4. Duke  treadmill score of 10, consistent with low risk for major  cardiac events  5. Exceptional exercise functional capacity (>150% of predicted  based on age and gender)     Assessment and Plan   1. CAD  - no current symptoms  - recent negative low risk exercise MPI - continue risk factor modification and secondary prevention   2. Hyperlipidemia  - continue current statin - request labs from pcp  3. GI bleed  - history of prior GI bleeds, all seem to be NSAID related including prior episode on ASA 81mg  per notes  - avoid NSAIDs  4. OSA screen - signs and symptoms concerning for OSA, he remains not interested in sleep study     Arnoldo Lenis, M.D.

## 2014-11-22 ENCOUNTER — Encounter (INDEPENDENT_AMBULATORY_CARE_PROVIDER_SITE_OTHER): Payer: Self-pay | Admitting: Internal Medicine

## 2014-11-22 ENCOUNTER — Ambulatory Visit (INDEPENDENT_AMBULATORY_CARE_PROVIDER_SITE_OTHER): Payer: Medicare Other | Admitting: Internal Medicine

## 2014-11-22 VITALS — BP 120/64 | HR 72 | Temp 97.4°F | Ht 71.0 in | Wt 217.5 lb

## 2014-11-22 DIAGNOSIS — K219 Gastro-esophageal reflux disease without esophagitis: Secondary | ICD-10-CM | POA: Diagnosis not present

## 2014-11-22 MED ORDER — OMEPRAZOLE 40 MG PO CPDR: 40.0000 mg | DELAYED_RELEASE_CAPSULE | Freq: Every day | ORAL | Status: DC

## 2014-11-22 NOTE — Patient Instructions (Signed)
Rx for Omepraozole. OV in 1 year.

## 2014-11-22 NOTE — Progress Notes (Signed)
Subjective:    Patient ID: Samuel Tanner, male    DOB: 03/30/1950, 64 y.o.   MRN: [id number]  HPI   Here today for f/u. Hx of colonic adenomas and family hx of colon cancer in father.  He tells me he is doing good. He denies any acid reflux. Appetite is good. No weight loss. He is walking for exercise but not a lot. He works a lot in the yard. He usually has a BM dialy. No melena or BRRB.   07/30/2013   Colonoscopy  Indications: Patient is 64 year old Caucasian male with history of colonic adenomas and family history of colon carcinoma in his father. Patient had tubular adenoma with dysplasia removed in 2002 and no polyps were found on his last exam 5 years ago. Father was diagnosed with colon carcinoma at age 32 and had second primary about 54 years later. Patient's personal history significant for peritoneal myxoma diagnosed about 20 years ago. He ha Impression:   Examination performed to cecum. Short colon since he has had partial colectomy or left hemicolectomy. Small submucosal lipoma at 35 cm from the anal margin. No evidence of recurrent polyps. Single small internal hemorrhoid.d partial colectomy and intraperitoneal chemotherapy at [facility] and has remained permission.  03/29/2013: EGD with biopsy and bleeding control therapy. Dr. Gala Romney: Gastric ulcer likely NSAID related with bleeding stigmata status post injection and clipping therapy; satellite erosions and ulcerations, intense erythema and superficial erosion of the antrum.  Multilobulated submucosal duodenal mass-status post biopsy, non-critical Schatzki's ring. Hiatal hernia.    Review of Systems Past Medical History  Diagnosis Date  . Hyperlipidemia     mixed  . Chest tightness, discomfort, or pressure   . Appendiceal carcinoid tumor ([place])     resection  15 yrs ago  . Ulcer   . MI (myocardial infarction) ([place])   . Barrett esophagus 03/29/13    Preformed by Dr.Rourk    Past Surgical History  Procedure Laterality  Date  .  appendiceal carcinoid   1996  . Cardiac catheterization      06/27/2007 and 09/17/2001  . Esophagogastroduodenoscopy N/A 03/29/2013    Procedure: ESOPHAGOGASTRODUODENOSCOPY (EGD);  Surgeon: Daneil Dolin, MD;  [facility];  Service: Endoscopy;  Laterality: N/A;  . Upper gastrointestinal endoscopy      03/29/13  . Colonoscopy    . Colonoscopy N/A 07/30/2013    Procedure: COLONOSCOPY;  Surgeon: Rogene Houston, MD;  [facility];  Service: Endoscopy;  Laterality: N/A;  1030  . Splenectomy  1996    along with colon resection  . Appendectomy  1996  . Tonsillectomy    . Hernia repair    . Cholecystectomy  1998  . Nasal septoplasty w/ turbinoplasty Bilateral 01/12/2014    Procedure: BILATERAL TURBINATE RESECTION/SEPTOPLASTY ;  Surgeon: Ascencion Dike, MD;  Location: [place];  Service: ENT;  Laterality: Bilateral;    Allergies  Allergen Reactions  . Asa [Aspirin]     PT HAS PEPTIC ULCER  . Morphine And Related     hallucinations    Current Outpatient Prescriptions on File Prior to Visit  Medication Sig Dispense Refill  . cholestyramine (QUESTRAN) 4 GM/DOSE powder 2 grams or 4 grams once or twice a day    . clopidogrel (PLAVIX) 75 MG tablet TAKE ONE TABLET BY MOUTH ONCE DAILY 90 tablet 1  . nitroGLYCERIN (NITROSTAT) 0.4 MG SL tablet Place 1 tablet (0.4 mg total) under the tongue every 5 (five)  minutes as needed for chest pain. 25 tablet 4  . pantoprazole (PROTONIX) 40 MG tablet Take 1 tablet (40 mg total) by mouth daily before breakfast. 30 tablet 11  . rosuvastatin (CRESTOR) 40 MG tablet Take 0.5 tablets (20 mg total) by mouth at bedtime. 45 tablet 3  . oxyCODONE-acetaminophen (ROXICET) 5-325 MG per tablet Take 1 tablet by mouth every 4 (four) hours as needed for moderate pain or severe pain. (Patient not taking: Reported on 11/22/2014) 30 tablet 0  . Probiotic Product (PROBIOTIC DAILY PO) Take by mouth daily. Per Patient he is taking this twice  a day     No current facility-administered medications on file prior to visit.        Objective:   Physical Exam Blood pressure 120/64, pulse 72, temperature 97.4 F (36.3 C), height 5\' 11"  (1.803 m), weight 217 lb 8 oz (98.657 kg). Alert and oriented. Skin warm and dry. Oral mucosa is moist.   . Sclera anicteric, conjunctivae is pink. Thyroid not enlarged. No cervical lymphadenopathy. Lungs clear. Heart regular rate and rhythm.  Abdomen is soft. Bowel sounds are positive. No hepatomegaly. No abdominal masses felt. No tenderness.  No edema to lower extremities.          Assessment & Plan:  GERD. Rx for Omeprazole 40mg  Daily. OV in 1 year. Separate the Omeprazole and Plavis. Take Omeprazole in am and Plavix in the evening.

## 2014-12-06 ENCOUNTER — Other Ambulatory Visit (INDEPENDENT_AMBULATORY_CARE_PROVIDER_SITE_OTHER): Payer: Self-pay | Admitting: Internal Medicine

## 2015-03-01 ENCOUNTER — Other Ambulatory Visit (INDEPENDENT_AMBULATORY_CARE_PROVIDER_SITE_OTHER): Payer: Self-pay | Admitting: Internal Medicine

## 2015-05-05 ENCOUNTER — Other Ambulatory Visit (INDEPENDENT_AMBULATORY_CARE_PROVIDER_SITE_OTHER): Payer: Self-pay | Admitting: Internal Medicine

## 2015-07-20 ENCOUNTER — Other Ambulatory Visit: Payer: Self-pay | Admitting: Cardiology

## 2015-08-25 ENCOUNTER — Encounter (INDEPENDENT_AMBULATORY_CARE_PROVIDER_SITE_OTHER): Payer: Self-pay | Admitting: Internal Medicine

## 2015-10-18 ENCOUNTER — Ambulatory Visit (INDEPENDENT_AMBULATORY_CARE_PROVIDER_SITE_OTHER): Payer: Medicare Other | Admitting: Cardiology

## 2015-10-18 ENCOUNTER — Encounter: Payer: Self-pay | Admitting: Cardiology

## 2015-10-18 VITALS — BP 130/82 | HR 66 | Ht 73.0 in | Wt 209.0 lb

## 2015-10-18 DIAGNOSIS — R42 Dizziness and giddiness: Secondary | ICD-10-CM | POA: Diagnosis not present

## 2015-10-18 DIAGNOSIS — I2510 Atherosclerotic heart disease of native coronary artery without angina pectoris: Secondary | ICD-10-CM | POA: Diagnosis not present

## 2015-10-18 DIAGNOSIS — E782 Mixed hyperlipidemia: Secondary | ICD-10-CM | POA: Diagnosis not present

## 2015-10-18 NOTE — Progress Notes (Signed)
Clinical Summary Samuel Tanner is a 64 y.o.male seen today for follow up of the following medical problems.   1. CAD  - prior stent to LCX  - last cath 2009 with patent stent, overall non-obstructive disease. LVEF at that time showed normal LV function.  - from prior notes developed peptic ulcer of ASA 81mg  daily, previous admit with GI bleeding thought secondary to NSAID use (aleve).  -  exercise stress nucler 06/2013, no ischemia with Duke treadmill score of 10.  - no chest pain. No SOB or DOE - compliant with meds. He is on plavix for secondary prevention due to ulcers on ASA, has tolerated well   2. Hyperlipidemia  - compliant with crestor  - has labs upcoming with pcp   3. Dizziness - monitor 07/2013 with no symptoms,no arrythmias - symptoms improved off metoprolol.  - no recent symptoms  5. OSA - + snoring. + daytime somnolence - has not been intersted in sleep study Past Medical History:  Diagnosis Date  . Appendiceal carcinoid tumor    resection  15 yrs ago  . Barrett esophagus 03/29/13   Preformed by Dr.Rourk  . Chest tightness, discomfort, or pressure   . Hyperlipidemia    mixed  . MI (myocardial infarction)   . Ulcer ([place])      Allergies  Allergen Reactions  . Asa [Aspirin]     PT HAS PEPTIC ULCER  . Morphine And Related     hallucinations     Current Outpatient Prescriptions  Medication Sig Dispense Refill  . cholestyramine (QUESTRAN) 4 GM/DOSE powder 2 grams or 4 grams once or twice a day    . clopidogrel (PLAVIX) 75 MG tablet TAKE ONE TABLET BY MOUTH ONCE DAILY 90 tablet 1  . CRESTOR 40 MG tablet TAKE ONE-HALF TABLET BY MOUTH AT BEDTIME 45 tablet 3  . nitroGLYCERIN (NITROSTAT) 0.4 MG SL tablet Place 1 tablet (0.4 mg total) under the tongue every 5 (five) minutes as needed for chest pain. 25 tablet 4  . omeprazole (PRILOSEC) 40 MG capsule Take 1 capsule (40 mg total) by mouth daily. 30 capsule 3  . oxyCODONE-acetaminophen (ROXICET) 5-325  MG per tablet Take 1 tablet by mouth every 4 (four) hours as needed for moderate pain or severe pain. (Patient not taking: Reported on 11/22/2014) 30 tablet 0  . pantoprazole (PROTONIX) 40 MG tablet TAKE ONE TABLET BY MOUTH ONCE DAILY BEFORE BREAKFAST 30 tablet 5  . Probiotic Product (PROBIOTIC DAILY PO) Take by mouth daily. Per Patient he is taking this twice a day     No current facility-administered medications for this visit.      Past Surgical History:  Procedure Laterality Date  .  appendiceal carcinoid   1996  . APPENDECTOMY  1996  . CARDIAC CATHETERIZATION     06/27/2007 and 09/17/2001  . CHOLECYSTECTOMY  1998  . COLONOSCOPY    . COLONOSCOPY N/A 07/30/2013   Procedure: COLONOSCOPY;  Surgeon: Rogene Houston, MD;  [facility];  Service: Endoscopy;  Laterality: N/A;  1030  . ESOPHAGOGASTRODUODENOSCOPY N/A 03/29/2013   Procedure: ESOPHAGOGASTRODUODENOSCOPY (EGD);  Surgeon: Daneil Dolin, MD;  [facility];  Service: Endoscopy;  Laterality: N/A;  . HERNIA REPAIR    . NASAL SEPTOPLASTY W/ TURBINOPLASTY Bilateral 01/12/2014   Procedure: BILATERAL TURBINATE RESECTION/SEPTOPLASTY ;  Surgeon: Ascencion Dike, MD;  Location: [place];  Service: ENT;  Laterality: Bilateral;  . SPLENECTOMY  1996   along  with colon resection  . TONSILLECTOMY    . UPPER GASTROINTESTINAL ENDOSCOPY     03/29/13     Allergies  Allergen Reactions  . Asa [Aspirin]     PT HAS PEPTIC ULCER  . Morphine And Related     hallucinations      Family History  Problem Relation Age of Onset  . Heart attack Father   . Cancer Father     prostate cancer  . Heart disease Father   . Colon cancer Father      Social History Samuel Tanner reports that he has never smoked. He has never used smokeless tobacco. Samuel Tanner reports that he does not drink alcohol.   Review of Systems CONSTITUTIONAL: No weight loss, fever, chills, weakness or fatigue.  HEENT: Eyes: No visual loss, blurred  vision, double vision or yellow sclerae.No hearing loss, sneezing, congestion, runny nose or sore throat.  SKIN: No rash or itching.  CARDIOVASCULAR: per HPI RESPIRATORY: No shortness of breath, cough or sputum.  GASTROINTESTINAL: No anorexia, nausea, vomiting or diarrhea. No abdominal pain or blood.  GENITOURINARY: No burning on urination, no polyuria NEUROLOGICAL: No headache, dizziness, syncope, paralysis, ataxia, numbness or tingling in the extremities. No change in bowel or bladder control.  MUSCULOSKELETAL: No muscle, back pain, joint pain or stiffness.  LYMPHATICS: No enlarged nodes. No history of splenectomy.  PSYCHIATRIC: No history of depression or anxiety.  ENDOCRINOLOGIC: No reports of sweating, cold or heat intolerance. No polyuria or polydipsia.  Marland Kitchen   Physical Examination Vitals:   10/18/15 1151  BP: 130/82  Pulse: 66   Vitals:   10/18/15 1151  Weight: 209 lb (94.8 kg)  Height: 6\' 1"  (1.854 m)    Gen: resting comfortably, no acute distress HEENT: no scleral icterus, pupils equal round and reactive, no palptable cervical adenopathy,  CV: RRR, no m/r/g, no jvd Resp: Clear to auscultation bilaterally GI: abdomen is soft, non-tender, non-distended, normal bowel sounds, no hepatosplenomegaly MSK: extremities are warm, no edema.  Skin: warm, no rash Neuro:  no focal deficits Psych: appropriate affect   Diagnostic Studies  06/2007 Cath  HEMODYNAMIC DATA:  1. Central aortic pressure was 118/78.  2. Left ventricular pressure 104/9.  3. There was no gradient on pullback across the aortic valve.  ANGIOGRAPHIC DATA:  1. Ventriculography done in the RAO projection reveals vigorous global  systolic function. No segmental abnormalities are identified.  There was a small inferior diverticulum, and this had been noted on  the previous angiographic study.  2. The left main is free of critical disease.  3. The LAD has a clear-cut fairly focal mid stenosis of  about 40%. It  does not appear to be high-grade or flow-limiting. The vessel then  goes distally to the apex where there was mild luminal irregularity  after the diagonal. The diagonal itself is a small bifurcating  vessel without critical narrowing.  4. The circumflex provides a large marginal Leve[place] that has been  stented. There is a perhaps about 20% narrowing in the midportion  of the stent which corresponds to the area seen on intravascular  ultrasound. Distally the vessel trifurcates. It does not appear  to be flow-limiting.  5. The right coronary artery has mild luminal irregularity but no  significant focal stenosis.  6. Intravascular ultrasound was performed. There was good apposition  throughout. The overall minimum lumen diameter appeared to be  appropriate for the stent being between 3.25 and 3.4 throughout  most of the stent. In the  midportion of the stent, there is a very  small filling defect that could represent tissue prolapse. Its  density is that of some plaque, although a small amount of layering  thrombus cannot be excluded.  CONCLUSION:  1. Preserved left ventricular function.  2. Mild disease of the left anterior descending artery.  3. Normal-appearing right coronary artery.  4. Continued patency of the previously placed stent with a small area  of filling defect possibly representing small area of plaque,  tissue prolapse, and/or thrombus.  DISPOSITION: The patient will be treated medically. We will increase  his Plavix to b.i.d. for about 1 week, then resume his normal amount.  We will need to monitor his symptoms prospectively.   06/2013 MPI  IMPRESSION:  1. Negative exercise MPI for ischemia  2. Negative stress EKG for ischemia  3. Normal left ventricular systolic function, LVEF [id number]  4. Duke treadmill score of 10, consistent with low risk for major  cardiac events  5. Exceptional exercise functional capacity (>150% of  predicted  based on age and gender)    Assessment and Plan  1. CAD  - no current symptoms  - continue current meds - EKG in clinic sinus brady, no acute ischemic changes  2. Hyperlipidemia  - continue current statin - request labs from primary doctor  3. OSA screen - signs and symptoms concerning for OSA, - he is not interested in sleep study  4. Dizziness - resolved off beta blocker - continue to monitor      Arnoldo Lenis, M.D.

## 2015-10-18 NOTE — Patient Instructions (Signed)
Your physician wants you to follow-up in: 1 year You will receive a reminder letter in the mail two months in advance. If you don't receive a letter, please call our office to schedule the follow-up appointment.   Your physician recommends that you continue on your current medications as directed. Please refer to the Current Medication list given to you today.    If you need a refill on your cardiac medications before your next appointment, please call your pharmacy.      Thank you for choosing [facility] !        

## 2015-10-28 ENCOUNTER — Other Ambulatory Visit: Payer: Self-pay | Admitting: Cardiology

## 2015-11-22 ENCOUNTER — Ambulatory Visit (INDEPENDENT_AMBULATORY_CARE_PROVIDER_SITE_OTHER): Payer: Medicare Other | Admitting: Internal Medicine

## 2015-11-22 ENCOUNTER — Encounter (INDEPENDENT_AMBULATORY_CARE_PROVIDER_SITE_OTHER): Payer: Self-pay | Admitting: Internal Medicine

## 2015-11-22 VITALS — BP 100/70 | HR 64 | Temp 97.4°F | Ht 73.0 in | Wt 211.2 lb

## 2015-11-22 DIAGNOSIS — Z86010 Personal history of colonic polyps: Secondary | ICD-10-CM | POA: Diagnosis not present

## 2015-11-22 NOTE — Progress Notes (Signed)
Subjective:    Patient ID: Samuel Tanner, male    DOB: 09-Apr-1950, 65 y.o.   MRN: [id number]  HPI Here today for f/u. He was last seen in November of 2016 by me.  Hx of colonic adenomas and family hx of colon cancer in father.   He tells me he is doing great. He feel good.  He is working around the house and walks for exercise.  His appetite is good. He has lost 6 pounds. No GERD.  He has a BM at least once a day. No melena or BRRB.  07/30/2013 Colonoscopy  Indications: Patient is 65 year old Caucasian male with history of colonic adenomas and family history of colon carcinoma in his father. Patient had tubular adenoma with dysplasia removed in 2002 and no polyps were found on his last exam 5 years ago. Father was diagnosed with colon carcinoma at age 31 and had second primary about 61 years later. Patient's personal history significant for peritoneal myxoma diagnosed about 20 years ago. He ha Impression:  Examination performed to cecum. Short colon since he has had partial colectomy or left hemicolectomy. Small submucosal lipoma at 35 cm from the anal margin. No evidence of recurrent polyps. Single small internal hemorrhoid.d partial colectomy and intraperitoneal chemotherapy at [facility] and has remained permission.  03/29/2013: EGD with biopsy and bleeding control therapy. Dr. Gala Romney: Gastric ulcer likely NSAID related with bleeding stigmata status post injection and clipping therapy; satellite erosions and ulcerations, intense erythema and superficial erosion of the antrum.  Multilobulated submucosal duodenal mass-status post biopsy, non-critical Schatzki's ring. Hiatal hernia.     Review of Systems Past Medical History:  Diagnosis Date  . Appendiceal carcinoid tumor    resection  15 yrs ago  . Barrett esophagus 03/29/13   Preformed by Dr.Rourk  . Chest tightness, discomfort, or pressure   . Hyperlipidemia    mixed  . MI (myocardial infarction)   . Ulcer [facility])     Past  Surgical History:  Procedure Laterality Date  .  appendiceal carcinoid   1996  . APPENDECTOMY  1996  . CARDIAC CATHETERIZATION     06/27/2007 and 09/17/2001  . CHOLECYSTECTOMY  1998  . COLONOSCOPY    . COLONOSCOPY N/A 07/30/2013   Procedure: COLONOSCOPY;  Surgeon: Rogene Houston, MD;  [facility];  Service: Endoscopy;  Laterality: N/A;  1030  . ESOPHAGOGASTRODUODENOSCOPY N/A 03/29/2013   Procedure: ESOPHAGOGASTRODUODENOSCOPY (EGD);  Surgeon: Daneil Dolin, MD;  [facility];  Service: Endoscopy;  Laterality: N/A;  . HERNIA REPAIR    . NASAL SEPTOPLASTY W/ TURBINOPLASTY Bilateral 01/12/2014   Procedure: BILATERAL TURBINATE RESECTION/SEPTOPLASTY ;  Surgeon: Ascencion Dike, MD;  Location: [place];  Service: ENT;  Laterality: Bilateral;  . SPLENECTOMY  1996   along with colon resection  . TONSILLECTOMY    . UPPER GASTROINTESTINAL ENDOSCOPY     03/29/13    Allergies  Allergen Reactions  . Asa [Aspirin]     PT HAS PEPTIC ULCER  . Morphine And Related     hallucinations    Current Outpatient Prescriptions on File Prior to Visit  Medication Sig Dispense Refill  . cholestyramine (QUESTRAN) 4 GM/DOSE powder 2 grams or 4 grams once or twice a day    . clopidogrel (PLAVIX) 75 MG tablet TAKE ONE TABLET BY MOUTH ONCE DAILY 90 tablet 3  . CRESTOR 40 MG tablet TAKE ONE-HALF TABLET BY MOUTH AT BEDTIME 45 tablet 3  . nitroGLYCERIN (NITROSTAT)  0.4 MG SL tablet Place 1 tablet (0.4 mg total) under the tongue every 5 (five) minutes as needed for chest pain. 25 tablet 4  . omeprazole (PRILOSEC) 40 MG capsule Take 1 capsule (40 mg total) by mouth daily. 30 capsule 3  . pantoprazole (PROTONIX) 40 MG tablet TAKE ONE TABLET BY MOUTH ONCE DAILY BEFORE BREAKFAST 30 tablet 5  . Probiotic Product (PROBIOTIC DAILY PO) Take by mouth daily. Per Patient he is taking this twice a day     No current facility-administered medications on file prior to visit.        Objective:     Physical Exam Blood pressure 100/70, pulse 64, temperature 97.4 F (36.3 C), height 6\' 1"  (1.854 m), weight 211 lb 3.2 oz (95.8 kg). Alert and oriented. Skin warm and dry. Oral mucosa is moist.   . Sclera anicteric, conjunctivae is pink. Thyroid not enlarged. No cervical lymphadenopathy. Lungs clear. Heart regular rate and rhythm.  Abdomen is soft. Bowel sounds are positive. No hepatomegaly. No abdominal masses felt. No tenderness.  No edema to lower extremities.          Assessment & Plan:  Colon polyp. He is doing well. GERD: no acid reflux.  OV in 1 year.

## 2015-11-22 NOTE — Patient Instructions (Signed)
OV in 1 year.  

## 2016-06-03 IMAGING — NM NM MYOCAR SINGLE W/SPECT W/WALL MOTION & EF
2 series · 12 of 12 positions shown · non-contrast
Comparison: None.

CLINICAL DATA: 63-year-old male with a known history of coronary
artery disease referred for chest pain.

EXAM:
MYOCARDIAL IMAGING WITH SPECT (REST AND EXERCISE)
GATED LEFT VENTRICULAR WALL MOTION STUDY
LEFT VENTRICULAR EJECTION FRACTION
TECHNIQUE: Standard myocardial SPECT imaging was performed after resting
intravenous injection of 10 mCi Jc-HHm sestamibi. Subsequently,
exercise tolerance test was performed by the patient under the
supervision of the Cardiology staff. At peak-stress, 30 mCi Jc-HHm
sestamibi was injected intravenously and standard myocardial SPECT
imaging was performed. Quantitative gated imaging was also performed
to evaluate left ventricular wall motion, and estimate left
ventricular ejection fraction.

[Series 1: rest · 8.28mm/px · 6 of 64 frames shown]
[frame 6/64]
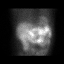
[frame 16/64]
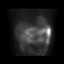
[frame 27/64]
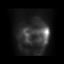
[frame 38/64]
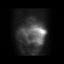
[frame 48/64]
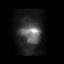
[frame 59/64]
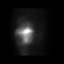

[Series 2: stress gated · 8.28mm/px · 6 of 64 frames shown]
[frame 6/64]
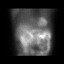
[frame 16/64]
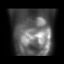
[frame 27/64]
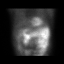
[frame 38/64]
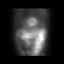
[frame 48/64]
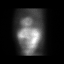
[frame 59/64]
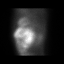

[12 of 12 positions shown; findings below may reference images not displayed]

FINDINGS: Exercise stress test

The patient was stressed according to the Bruce protocol for 10 min
and 11 seconds achieving a work level of 13.4 Mets. The resting
heart rate of 68 beats per min increased to a rate of 151 beats per
min, representing 97% of the maximal age predicted target heart
rate. The resting blood pressure increased from 129/89 up to 181/83.
The test was stopped due to fatigue, the patient did not experience
any chest pain.

Baseline EKG showed normal sinus rhythm. Stress EKG showed no
specific ischemic changes and no significant arrhythmias. There was
occasional isolated PVCs and also ventricular by Lilla, however no
significant sustained arrhythmias.

Myocardial perfusion imaging

Raw images showed appropriate radiotracer uptake. There is noted
heavy gut uptake in the resting images. There was a large severe
intensity inferior wall defect seen in the resting images, this area
was much more mild and nearly resolved in the post stress images.
The inferior wall had normal wall motion, overall findings are
consistent with sub- diaphragmatic attenuation. There are no other
myocardial perfusion defects.

Gated imaging showed end-diastolic volume 95 mL, end systolic volume
29 mL, left ventricular ejection fraction 70%, wall motion was
normal.
IMPRESSION: 1.  Negative exercise MPI for ischemia

2.  Negative stress EKG for ischemia

3.  Normal left ventricular systolic function, LVEF 70%

4. Duke treadmill score of 10, consistent with low risk for major
cardiac events

5. Exceptional exercise functional capacity (>150% of predicted
based on age and gender)

## 2016-08-01 ENCOUNTER — Other Ambulatory Visit (INDEPENDENT_AMBULATORY_CARE_PROVIDER_SITE_OTHER): Payer: Self-pay | Admitting: Internal Medicine

## 2016-08-01 ENCOUNTER — Other Ambulatory Visit: Payer: Self-pay | Admitting: Cardiology

## 2016-10-12 ENCOUNTER — Encounter (INDEPENDENT_AMBULATORY_CARE_PROVIDER_SITE_OTHER): Payer: Self-pay | Admitting: Internal Medicine

## 2016-10-12 ENCOUNTER — Encounter (INDEPENDENT_AMBULATORY_CARE_PROVIDER_SITE_OTHER): Payer: Self-pay

## 2016-11-19 ENCOUNTER — Other Ambulatory Visit: Payer: Self-pay | Admitting: Cardiology

## 2016-11-21 ENCOUNTER — Ambulatory Visit (INDEPENDENT_AMBULATORY_CARE_PROVIDER_SITE_OTHER): Payer: Medicare Other | Admitting: Internal Medicine

## 2017-01-24 ENCOUNTER — Encounter: Payer: Self-pay | Admitting: Cardiology

## 2017-01-24 ENCOUNTER — Ambulatory Visit: Payer: Medicare Other | Admitting: Cardiology

## 2017-01-24 VITALS — BP 116/74 | HR 78 | Ht 73.0 in | Wt 210.0 lb

## 2017-01-24 DIAGNOSIS — I2510 Atherosclerotic heart disease of native coronary artery without angina pectoris: Secondary | ICD-10-CM

## 2017-01-24 DIAGNOSIS — E782 Mixed hyperlipidemia: Secondary | ICD-10-CM | POA: Diagnosis not present

## 2017-01-24 NOTE — Progress Notes (Signed)
Clinical Summary Mr. Millspaugh is a 67 y.o.male  seen today for follow up of the following medical problems.   1. CAD  - prior stent to LCX  - last cath 2009 with patent stent, overall non-obstructive disease. LVEF at that time showed normal LV function.  - from prior notes developed peptic ulcer of ASA 81mg  daily, previous admit with GI bleeding thought secondary to NSAID use (aleve). Has been on plavix for secondary prevention.  -  exercise stress nucler 06/2013, no ischemia with Duke treadmill score of 10.  - no recent chest pain. No SOB/DOE - compliant with meds.   2. Hyperlipidemia  - compliant with meds   3. OSA screen - + snoring. + daytime somnolence - has not been intersted in sleep study   Past Medical History:  Diagnosis Date  . Appendiceal carcinoid tumor    resection  15 yrs ago  . Barrett esophagus 03/29/13   Preformed by Dr.Rourk  . Chest tightness, discomfort, or pressure   . Hyperlipidemia    mixed  . MI (myocardial infarction)   . Ulcer ([place])      Allergies  Allergen Reactions  . Asa [Aspirin]     PT HAS PEPTIC ULCER  . Morphine And Related     hallucinations     Current Outpatient Medications  Medication Sig Dispense Refill  . cholestyramine (QUESTRAN) 4 GM/DOSE powder 2 grams or 4 grams once or twice a day    . clopidogrel (PLAVIX) 75 MG tablet TAKE ONE TABLET BY MOUTH ONCE DAILY 90 tablet 1  . nitroGLYCERIN (NITROSTAT) 0.4 MG SL tablet Place 1 tablet (0.4 mg total) under the tongue every 5 (five) minutes as needed for chest pain. 25 tablet 4  . pantoprazole (PROTONIX) 40 MG tablet TAKE ONE TABLET BY MOUTH ONCE DAILY BEFORE BREAKFAST 30 tablet 5  . Probiotic Product (PROBIOTIC DAILY PO) Take by mouth daily. Per Patient he is taking this twice a day    . rosuvastatin (CRESTOR) 40 MG tablet TAKE ONE-HALF TABLET BY MOUTH AT BEDTIME 45 tablet 3   No current facility-administered medications for this visit.      Past Surgical History:   Procedure Laterality Date  .  appendiceal carcinoid   1996  . APPENDECTOMY  1996  . CARDIAC CATHETERIZATION     06/27/2007 and 09/17/2001  . CHOLECYSTECTOMY  1998  . COLONOSCOPY    . COLONOSCOPY N/A 07/30/2013   Procedure: COLONOSCOPY;  Surgeon: Rogene Houston, MD;  [facility];  Service: Endoscopy;  Laterality: N/A;  1030  . ESOPHAGOGASTRODUODENOSCOPY N/A 03/29/2013   Procedure: ESOPHAGOGASTRODUODENOSCOPY (EGD);  Surgeon: Daneil Dolin, MD;  [facility];  Service: Endoscopy;  Laterality: N/A;  . HERNIA REPAIR    . NASAL SEPTOPLASTY W/ TURBINOPLASTY Bilateral 01/12/2014   Procedure: BILATERAL TURBINATE RESECTION/SEPTOPLASTY ;  Surgeon: Ascencion Dike, MD;  Location: [place];  Service: ENT;  Laterality: Bilateral;  . SPLENECTOMY  1996   along with colon resection  . TONSILLECTOMY    . UPPER GASTROINTESTINAL ENDOSCOPY     03/29/13     Allergies  Allergen Reactions  . Asa [Aspirin]     PT HAS PEPTIC ULCER  . Morphine And Related     hallucinations      Family History  Problem Relation Age of Onset  . Heart attack Father   . Cancer Father        prostate cancer  . Heart disease  Father   . Colon cancer Father      Social History Mr. Rafalski reports that  has never smoked. he has never used smokeless tobacco. Mr. Koller reports that he does not drink alcohol.   Review of Systems CONSTITUTIONAL: No weight loss, fever, chills, weakness or fatigue.  HEENT: Eyes: No visual loss, blurred vision, double vision or yellow sclerae.No hearing loss, sneezing, congestion, runny nose or sore throat.  SKIN: No rash or itching.  CARDIOVASCULAR: per hpi RESPIRATORY: No shortness of breath, cough or sputum.  GASTROINTESTINAL: No anorexia, nausea, vomiting or diarrhea. No abdominal pain or blood.  GENITOURINARY: No burning on urination, no polyuria NEUROLOGICAL: No headache, dizziness, syncope, paralysis, ataxia, numbness or tingling in the extremities.  No change in bowel or bladder control.  MUSCULOSKELETAL: No muscle, back pain, joint pain or stiffness.  LYMPHATICS: No enlarged nodes. No history of splenectomy.  PSYCHIATRIC: No history of depression or anxiety.  ENDOCRINOLOGIC: No reports of sweating, cold or heat intolerance. No polyuria or polydipsia.  Marland Kitchen   Physical Examination Vitals:   01/24/17 0816  BP: 116/74  Pulse: 78  SpO2: 95%   Vitals:   01/24/17 0816  Weight: 210 lb (95.3 kg)  Height: 6\' 1"  (1.854 m)    Gen: resting comfortably, no acute distress HEENT: no scleral icterus, pupils equal round and reactive, no palptable cervical adenopathy,  CV: RRR, no m/r/g, no jvd Resp: Clear to auscultation bilaterally GI: abdomen is soft, non-tender, non-distended, normal bowel sounds, no hepatosplenomegaly MSK: extremities are warm, no edema.  Skin: warm, no rash Neuro:  no focal deficits Psych: appropriate affect   Diagnostic Studies 06/2007 Cath HEMODYNAMIC DATA:  1. Central aortic pressure was 118/78.  2. Left ventricular pressure 104/9.  3. There was no gradient on pullback across the aortic valve.  ANGIOGRAPHIC DATA:  1. Ventriculography done in the RAO projection reveals vigorous global  systolic function. No segmental abnormalities are identified.  There was a small inferior diverticulum, and this had been noted on  the previous angiographic study.  2. The left main is free of critical disease.  3. The LAD has a clear-cut fairly focal mid stenosis of about 40%. It  does not appear to be high-grade or flow-limiting. The vessel then  goes distally to the apex where there was mild luminal irregularity  after the diagonal. The diagonal itself is a small bifurcating  vessel without critical narrowing.  4. The circumflex provides a large marginal [place] that has been  stented. There is a perhaps about 20% narrowing in the midportion  of the stent which corresponds to the area seen on  intravascular  ultrasound. Distally the vessel trifurcates. It does not appear  to be flow-limiting.  5. The right coronary artery has mild luminal irregularity but no  significant focal stenosis.  6. Intravascular ultrasound was performed. There was good apposition  throughout. The overall minimum lumen diameter appeared to be  appropriate for the stent being between 3.25 and 3.4 throughout  most of the stent. In the midportion of the stent, there is a very  small filling defect that could represent tissue prolapse. Its  density is that of some plaque, although a small amount of layering  thrombus cannot be excluded.  CONCLUSION:  1. Preserved left ventricular function.  2. Mild disease of the left anterior descending artery.  3. Normal-appearing right coronary artery.  4. Continued patency of the previously placed stent with a small area  of filling defect possibly representing small  area of plaque,  tissue prolapse, and/or thrombus.  DISPOSITION: The patient will be treated medically. We will increase  his Plavix to b.i.d. for about 1 week, then resume his normal amount.  We will need to monitor his symptoms prospectively.   06/2013 MPI  IMPRESSION:  1. Negative exercise MPI for ischemia  2. Negative stress EKG for ischemia  3. Normal left ventricular systolic function, LVEF 37%  4. Duke treadmill score of 10, consistent with low risk for major  cardiac events  5. Exceptional exercise functional capacity (>150% of predicted  based on age and gender)      Assessment and Plan   1. CAD  - no symptoms, continue current meds - EKG in clinic SR, no acute ischemic changes, isolated PVC  2. Hyperlipidemia  - request labs from [facility] - continues statin      Arnoldo Lenis, M.D.

## 2017-01-24 NOTE — Patient Instructions (Signed)
Medication Instructions:  Your physician recommends that you continue on your current medications as directed. Please refer to the Current Medication list given to you today.   Labwork: I will request a copy of labs from PCP.   Testing/Procedures: NONE  Follow-Up: Your physician wants you to follow-up in: 1 YEAR.  You will receive a reminder letter in the mail two months in advance. If you don't receive a letter, please call our office to schedule the follow-up appointment.   Any Other Special Instructions Will Be Listed Below (If Applicable).     If you need a refill on your cardiac medications before your next appointment, please call your pharmacy.

## 2017-01-28 ENCOUNTER — Encounter: Payer: Self-pay | Admitting: Cardiology

## 2017-08-27 ENCOUNTER — Other Ambulatory Visit: Payer: Self-pay | Admitting: Cardiology

## 2017-09-30 ENCOUNTER — Other Ambulatory Visit: Payer: Self-pay | Admitting: Cardiology

## 2017-11-27 ENCOUNTER — Other Ambulatory Visit (INDEPENDENT_AMBULATORY_CARE_PROVIDER_SITE_OTHER): Payer: Self-pay | Admitting: Internal Medicine

## 2018-01-24 ENCOUNTER — Encounter: Payer: Self-pay | Admitting: Cardiology

## 2018-01-24 ENCOUNTER — Ambulatory Visit: Payer: Medicare Other | Admitting: Cardiology

## 2018-01-24 VITALS — BP 104/60 | HR 82 | Ht 73.0 in | Wt 208.0 lb

## 2018-01-24 DIAGNOSIS — I2510 Atherosclerotic heart disease of native coronary artery without angina pectoris: Secondary | ICD-10-CM | POA: Diagnosis not present

## 2018-01-24 DIAGNOSIS — E782 Mixed hyperlipidemia: Secondary | ICD-10-CM | POA: Diagnosis not present

## 2018-01-24 NOTE — Patient Instructions (Signed)
Medication Instructions:  Your physician recommends that you continue on your current medications as directed. Please refer to the Current Medication list given to you today.   Labwork: I will request labs from pcp  Testing/Procedures: none  Follow-Up: Your physician wants you to follow-up in: 1 year.  You will receive a reminder letter in the mail two months in advance. If you don't receive a letter, please call our office to schedule the follow-up appointment.   Any Other Special Instructions Will Be Listed Below (If Applicable).     If you need a refill on your cardiac medications before your next appointment, please call your pharmacy.  1

## 2018-01-24 NOTE — Progress Notes (Signed)
Clinical Summary Samuel Tanner is a 68 y.o.male seen today for follow up of the following medical problems.  1. CAD  - prior stent to LCX  - last cath 2009 with patent stent, overall non-obstructive disease. LVEF at that time showed normal LV function.  - from prior notes developed peptic ulcer of ASA 81mg  daily, previous admit with GI bleeding thought secondary to NSAID use (aleve). Has been on plavix for secondary prevention. Soft bp's prohibit ACE-I or beta blocker - exercise stress nucler 06/2013, no ischemia with Duke treadmill score of 10.   - no recent chest pain. No SOB/DOE - compliant with meds  2. Hyperlipidemia  - recent labs with pcp - compliant with statin   3. OSA screen - + snoring. + daytime somnolence - has not been intersted in sleep study    Past Medical History:  Diagnosis Date  . Appendiceal carcinoid tumor    resection  15 yrs ago  . Barrett esophagus 03/29/13   Preformed by Dr.Rourk  . Chest tightness, discomfort, or pressure   . Hyperlipidemia    mixed  . MI (myocardial infarction) ([place])   . Ulcer      Allergies  Allergen Reactions  . Asa [Aspirin]     PT HAS PEPTIC ULCER  . Morphine And Related     hallucinations     Current Outpatient Medications  Medication Sig Dispense Refill  . cholestyramine (QUESTRAN) 4 GM/DOSE powder 2 grams or 4 grams once or twice a day    . clopidogrel (PLAVIX) 75 MG tablet TAKE 1 TABLET BY MOUTH ONCE DAILY 90 tablet 1  . nitroGLYCERIN (NITROSTAT) 0.4 MG SL tablet Place 1 tablet (0.4 mg total) under the tongue every 5 (five) minutes as needed for chest pain. 25 tablet 4  . pantoprazole (PROTONIX) 40 MG tablet TAKE 1 TABLET BY MOUTH ONCE DAILY BEFORE BREAKFAST 30 tablet 5  . Probiotic Product (PROBIOTIC DAILY PO) Take by mouth daily. Per Patient he is taking this twice a day    . rosuvastatin (CRESTOR) 40 MG tablet TAKE 1/2 (ONE-HALF) TABLET BY MOUTH ONCE DAILY AT BEDTIME 45 tablet 3   No  current facility-administered medications for this visit.      Past Surgical History:  Procedure Laterality Date  .  appendiceal carcinoid   1996  . APPENDECTOMY  1996  . CARDIAC CATHETERIZATION     06/27/2007 and 09/17/2001  . CHOLECYSTECTOMY  1998  . COLONOSCOPY    . COLONOSCOPY N/A 07/30/2013   Procedure: COLONOSCOPY;  Surgeon: Rogene Houston, MD;  [facility];  Service: Endoscopy;  Laterality: N/A;  1030  . ESOPHAGOGASTRODUODENOSCOPY N/A 03/29/2013   Procedure: ESOPHAGOGASTRODUODENOSCOPY (EGD);  Surgeon: Daneil Dolin, MD;  [facility];  Service: Endoscopy;  Laterality: N/A;  . HERNIA REPAIR    . NASAL SEPTOPLASTY W/ TURBINOPLASTY Bilateral 01/12/2014   Procedure: BILATERAL TURBINATE RESECTION/SEPTOPLASTY ;  Surgeon: Ascencion Dike, MD;  Location: [place];  Service: ENT;  Laterality: Bilateral;  . SPLENECTOMY  1996   along with colon resection  . TONSILLECTOMY    . UPPER GASTROINTESTINAL ENDOSCOPY     03/29/13     Allergies  Allergen Reactions  . Asa [Aspirin]     PT HAS PEPTIC ULCER  . Morphine And Related     hallucinations      Family History  Problem Relation Age of Onset  . Heart attack Father   . Cancer Father  prostate cancer  . Heart disease Father   . Colon cancer Father      Social History Samuel Tanner reports that he has never smoked. He has never used smokeless tobacco. Samuel Tanner reports no history of alcohol use.   Review of Systems CONSTITUTIONAL: No weight loss, fever, chills, weakness or fatigue.  HEENT: Eyes: No visual loss, blurred vision, double vision or yellow sclerae.No hearing loss, sneezing, congestion, runny nose or sore throat.  SKIN: No rash or itching.  CARDIOVASCULAR: per hpi RESPIRATORY: No shortness of breath, cough or sputum.  GASTROINTESTINAL: No anorexia, nausea, vomiting or diarrhea. No abdominal pain or blood.  GENITOURINARY: No burning on urination, no polyuria NEUROLOGICAL: No  headache, dizziness, syncope, paralysis, ataxia, numbness or tingling in the extremities. No change in bowel or bladder control.  MUSCULOSKELETAL: No muscle, back pain, joint pain or stiffness.  LYMPHATICS: No enlarged nodes. No history of splenectomy.  PSYCHIATRIC: No history of depression or anxiety.  ENDOCRINOLOGIC: No reports of sweating, cold or heat intolerance. No polyuria or polydipsia.  Marland Kitchen   Physical Examination Vitals:   01/24/18 0825  BP: 104/60  Pulse: 82  SpO2: 96%   Vitals:   01/24/18 0825  Weight: 208 lb (94.3 kg)  Height: 6\' 1"  (1.854 m)    Gen: resting comfortably, no acute distress HEENT: no scleral icterus, pupils equal round and reactive, no palptable cervical adenopathy,  CV: RRR, no m/r/g, no jvd Resp: Clear to auscultation bilaterally GI: abdomen is soft, non-tender, non-distended, normal bowel sounds, no hepatosplenomegaly MSK: extremities are warm, no edema.  Skin: warm, no rash Neuro:  no focal deficits Psych: appropriate affect   Diagnostic Studies 06/2007 Cath HEMODYNAMIC DATA:  1. Central aortic pressure was 118/78.  2. Left ventricular pressure 104/9.  3. There was no gradient on pullback across the aortic valve.  ANGIOGRAPHIC DATA:  1. Ventriculography done in the RAO projection reveals vigorous global  systolic function. No segmental abnormalities are identified.  There was a small inferior diverticulum, and this had been noted on  the previous angiographic study.  2. The left main is free of critical disease.  3. The LAD has a clear-cut fairly focal mid stenosis of about 40%. It  does not appear to be high-grade or flow-limiting. The vessel then  goes distally to the apex where there was mild luminal irregularity  after the diagonal. The diagonal itself is a small bifurcating  vessel without critical narrowing.  4. The circumflex provides a large marginal [place] that has been  stented. There is a perhaps about 20%  narrowing in the midportion  of the stent which corresponds to the area seen on intravascular  ultrasound. Distally the vessel trifurcates. It does not appear  to be flow-limiting.  5. The right coronary artery has mild luminal irregularity but no  significant focal stenosis.  6. Intravascular ultrasound was performed. There was good apposition  throughout. The overall minimum lumen diameter appeared to be  appropriate for the stent being between 3.25 and 3.4 throughout  most of the stent. In the midportion of the stent, there is a very  small filling defect that could represent tissue prolapse. Its  density is that of some plaque, although a small amount of layering  thrombus cannot be excluded.  CONCLUSION:  1. Preserved left ventricular function.  2. Mild disease of the left anterior descending artery.  3. Normal-appearing right coronary artery.  4. Continued patency of the previously placed stent with a small area  of  filling defect possibly representing small area of plaque,  tissue prolapse, and/or thrombus.  DISPOSITION: The patient will be treated medically. We will increase  his Plavix to b.i.d. for about 1 week, then resume his normal amount.  We will need to monitor his symptoms prospectively.   06/2013 MPI  IMPRESSION:  1. Negative exercise MPI for ischemia  2. Negative stress EKG for ischemia  3. Normal left ventricular systolic function, LVEF 89%  4. Duke treadmill score of 10, consistent with low risk for major  cardiac events  5. Exceptional exercise functional capacity (>150% of predicted  based on age and gender)    Assessment and Plan  1. CAD  - no symptoms, continue current meds - on plavix for secondary prevention due to GI issues on ASA - not on ACE-I or beta blocker due to soft bp's  2. Hyperlipidemia  - continue statin, request labs from pcp   F/u 1 year      Arnoldo Lenis, M.D.

## 2018-07-22 ENCOUNTER — Encounter (INDEPENDENT_AMBULATORY_CARE_PROVIDER_SITE_OTHER): Payer: Self-pay | Admitting: *Deleted

## 2018-09-15 ENCOUNTER — Other Ambulatory Visit (INDEPENDENT_AMBULATORY_CARE_PROVIDER_SITE_OTHER): Payer: Self-pay | Admitting: *Deleted

## 2018-09-15 DIAGNOSIS — Z860100 Personal history of colon polyps, unspecified: Secondary | ICD-10-CM | POA: Insufficient documentation

## 2018-09-15 DIAGNOSIS — Z800 Family history of malignant neoplasm of digestive organs: Secondary | ICD-10-CM | POA: Insufficient documentation

## 2018-09-17 ENCOUNTER — Encounter (INDEPENDENT_AMBULATORY_CARE_PROVIDER_SITE_OTHER): Payer: Self-pay | Admitting: *Deleted

## 2018-09-17 ENCOUNTER — Other Ambulatory Visit: Payer: Self-pay | Admitting: Cardiology

## 2018-09-17 ENCOUNTER — Telehealth (INDEPENDENT_AMBULATORY_CARE_PROVIDER_SITE_OTHER): Payer: Self-pay | Admitting: *Deleted

## 2018-09-17 DIAGNOSIS — Z86010 Personal history of colonic polyps: Secondary | ICD-10-CM

## 2018-09-17 MED ORDER — PEG 3350-KCL-NA BICARB-NACL 420 G PO SOLR: 4000.0000 mL | Freq: Once | ORAL | 0 refills | Status: AC

## 2018-09-17 NOTE — Telephone Encounter (Signed)
Patient needs trilyte TCS sch'd 10/21

## 2018-09-24 ENCOUNTER — Other Ambulatory Visit: Payer: Self-pay | Admitting: Cardiology

## 2018-10-07 ENCOUNTER — Other Ambulatory Visit: Payer: Self-pay

## 2018-10-07 DIAGNOSIS — Z20822 Contact with and (suspected) exposure to covid-19: Secondary | ICD-10-CM

## 2018-10-08 LAB — NOVEL CORONAVIRUS, NAA: SARS-CoV-2, NAA: NOT DETECTED

## 2018-10-09 ENCOUNTER — Other Ambulatory Visit: Payer: Self-pay

## 2018-10-09 ENCOUNTER — Ambulatory Visit (INDEPENDENT_AMBULATORY_CARE_PROVIDER_SITE_OTHER): Payer: Self-pay

## 2018-10-09 ENCOUNTER — Telehealth (INDEPENDENT_AMBULATORY_CARE_PROVIDER_SITE_OTHER): Payer: Self-pay | Admitting: *Deleted

## 2018-10-09 NOTE — Telephone Encounter (Signed)
Referring MD/PCP: hall   Procedure: tcs  Reason/Indication:  Hx polyps, fam hx colon ca  Has patient had this procedure before?  Yes, 5 yrs ago  If so, when, by whom and where?    Is there a family history of colon cancer?  Yes, father  Who?  What age when diagnosed?    Is patient diabetic?   no      Does patient have prosthetic heart valve or mechanical valve?  no  Do you have a pacemaker/defibrillator?  no  Has patient ever had endocarditis/atrial fibrillation? no  Does patient use oxygen? no  Has patient had joint replacement within last 12 months?  no  Is patient constipated or do they take laxatives? no  Does patient have a history of alcohol/drug use?  on  Is patient on blood thinner such as Coumadin, Plavix and/or Aspirin? yes  Medications: plavix 40 mg daily, rosuvastatin 40 mg daily  Allergies: morphine  Medication Adjustment per Dr Charlena Cross, NP: hold plavix 5 days prior  Procedure date & time: 11/05/18 at 12

## 2018-10-09 NOTE — Telephone Encounter (Signed)
plavix requests have to go to MD

## 2018-10-09 NOTE — Telephone Encounter (Signed)
Patient is scheduled for colonoscopy 11/05/18 and needs to stop Plavix 5 days prior - please advise if ok to stop, thanks

## 2018-10-10 NOTE — Telephone Encounter (Signed)
Ok to hold plavix for procedure as you have outlined   Zandra Abts MD

## 2018-10-13 NOTE — Telephone Encounter (Signed)
Patient aware.

## 2018-10-13 NOTE — Telephone Encounter (Signed)
Okay to schedule colonoscopy with conscious sedation 

## 2018-11-03 ENCOUNTER — Other Ambulatory Visit (HOSPITAL_COMMUNITY)
Admission: RE | Admit: 2018-11-03 | Discharge: 2018-11-03 | Disposition: A | Discharge status: Home / Self Care | Payer: Medicare Other | Source: Ambulatory Visit | Attending: Internal Medicine | Admitting: Internal Medicine

## 2018-11-03 ENCOUNTER — Encounter (INDEPENDENT_AMBULATORY_CARE_PROVIDER_SITE_OTHER): Payer: Self-pay | Admitting: *Deleted

## 2018-11-03 DIAGNOSIS — Z01812 Encounter for preprocedural laboratory examination: Secondary | ICD-10-CM | POA: Diagnosis present

## 2018-11-03 DIAGNOSIS — Z20828 Contact with and (suspected) exposure to other viral communicable diseases: Secondary | ICD-10-CM | POA: Insufficient documentation

## 2018-11-03 LAB — SARS CORONAVIRUS 2 (TAT 6-24 HRS): SARS Coronavirus 2: NEGATIVE

## 2018-11-05 ENCOUNTER — Other Ambulatory Visit: Payer: Self-pay

## 2018-11-05 ENCOUNTER — Ambulatory Visit (HOSPITAL_COMMUNITY)
Admission: RE | Admit: 2018-11-05 | Discharge: 2018-11-05 | Disposition: A | Discharge status: Home / Self Care | Payer: Medicare Other | Attending: Internal Medicine | Admitting: Internal Medicine

## 2018-11-05 ENCOUNTER — Encounter (HOSPITAL_COMMUNITY)
Admission: RE | Disposition: A | Discharge status: Home / Self Care | Payer: Self-pay | Source: Home / Self Care | Attending: Internal Medicine

## 2018-11-05 ENCOUNTER — Encounter (HOSPITAL_COMMUNITY): Payer: Self-pay | Admitting: *Deleted

## 2018-11-05 DIAGNOSIS — Z860100 Personal history of colon polyps, unspecified: Secondary | ICD-10-CM | POA: Insufficient documentation

## 2018-11-05 DIAGNOSIS — Z7902 Long term (current) use of antithrombotics/antiplatelets: Secondary | ICD-10-CM | POA: Diagnosis not present

## 2018-11-05 DIAGNOSIS — Z9221 Personal history of antineoplastic chemotherapy: Secondary | ICD-10-CM | POA: Insufficient documentation

## 2018-11-05 DIAGNOSIS — I252 Old myocardial infarction: Secondary | ICD-10-CM | POA: Diagnosis not present

## 2018-11-05 DIAGNOSIS — Z1211 Encounter for screening for malignant neoplasm of colon: Secondary | ICD-10-CM | POA: Insufficient documentation

## 2018-11-05 DIAGNOSIS — E782 Mixed hyperlipidemia: Secondary | ICD-10-CM | POA: Diagnosis not present

## 2018-11-05 DIAGNOSIS — Z800 Family history of malignant neoplasm of digestive organs: Secondary | ICD-10-CM | POA: Diagnosis not present

## 2018-11-05 DIAGNOSIS — R197 Diarrhea, unspecified: Secondary | ICD-10-CM | POA: Diagnosis not present

## 2018-11-05 DIAGNOSIS — Z980 Intestinal bypass and anastomosis status: Secondary | ICD-10-CM | POA: Diagnosis not present

## 2018-11-05 DIAGNOSIS — I2510 Atherosclerotic heart disease of native coronary artery without angina pectoris: Secondary | ICD-10-CM | POA: Insufficient documentation

## 2018-11-05 DIAGNOSIS — K5730 Diverticulosis of large intestine without perforation or abscess without bleeding: Secondary | ICD-10-CM | POA: Diagnosis not present

## 2018-11-05 DIAGNOSIS — Z79899 Other long term (current) drug therapy: Secondary | ICD-10-CM | POA: Diagnosis not present

## 2018-11-05 DIAGNOSIS — Z09 Encounter for follow-up examination after completed treatment for conditions other than malignant neoplasm: Secondary | ICD-10-CM | POA: Diagnosis not present

## 2018-11-05 HISTORY — PX: COLONOSCOPY: SHX5424

## 2018-11-05 HISTORY — DX: Atherosclerotic heart disease of native coronary artery without angina pectoris: I25.10

## 2018-11-05 SURGERY — COLONOSCOPY
Anesthesia: Moderate Sedation

## 2018-11-05 MED ORDER — STERILE WATER FOR IRRIGATION IR SOLN
Status: DC | PRN
  Administered 2018-11-05: 2.5 mL

## 2018-11-05 MED ORDER — PANTOPRAZOLE SODIUM 40 MG PO TBEC: 40.0000 mg | DELAYED_RELEASE_TABLET | Freq: Every day | ORAL | 3 refills | Status: DC

## 2018-11-05 MED ORDER — MIDAZOLAM HCL 5 MG/5ML IJ SOLN
INTRAMUSCULAR | Status: DC | PRN
  Administered 2018-11-05 (×3): 2 mg via INTRAVENOUS

## 2018-11-05 MED ORDER — MEPERIDINE HCL 50 MG/ML IJ SOLN
INTRAMUSCULAR | Status: DC | PRN
  Administered 2018-11-05 (×2): 25 mg via INTRAVENOUS

## 2018-11-05 MED ORDER — MIDAZOLAM HCL 5 MG/5ML IJ SOLN
INTRAMUSCULAR | Status: AC
  Filled 2018-11-05: qty 10

## 2018-11-05 MED ORDER — MEPERIDINE HCL 50 MG/ML IJ SOLN
INTRAMUSCULAR | Status: AC
  Filled 2018-11-05: qty 1

## 2018-11-05 MED ORDER — SODIUM CHLORIDE 0.9 % IV SOLN
INTRAVENOUS | Status: DC
  Administered 2018-11-05: 11:00:00 1000 mL via INTRAVENOUS

## 2018-11-05 NOTE — H&P (Signed)
Samuel Tanner is an 68 y.o. male.   Chief Complaint: Patient is here for colonoscopy. HPI: Patient is 68 year old Caucasian male who has a history of colonic adenomas and family history of CRC who is here for surveillance colonoscopy.  Last exam was in 2015 and no polyps were found.  He had tubular adenoma with dysplasia back in 2002. He denies abdominal pain or rectal bleeding.  His diarrhea is well controlled with cholestyramine. He has been off Plavix for 5 days. 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 Family history significant for CRC in his father who was 58 at the time of diagnosis.  He had second primary 17 years later.  He lived to be 30.  Past Medical History:  Diagnosis Date  . Appendiceal carcinoid tumor    resection  15 yrs ago  . Barrett esophagus 03/29/13   Preformed by Dr.Rourk  . Chest tightness, discomfort, or pressure   . Coronary artery disease   . Hyperlipidemia    mixed  . MI (myocardial infarction) ([facility])   . Ulcer     Past Surgical History:  Procedure Laterality Date  .  appendiceal carcinoid   1996  . APPENDECTOMY  1996  . CARDIAC CATHETERIZATION     06/27/2007 and 09/17/2001  . CHOLECYSTECTOMY  1998  . COLONOSCOPY    . COLONOSCOPY N/A 07/30/2013   Procedure: COLONOSCOPY;  Surgeon: Rogene Houston, MD;  Location: [facility];  Service: Endoscopy;  Laterality: N/A;  1030  . ESOPHAGOGASTRODUODENOSCOPY N/A 03/29/2013   Procedure: ESOPHAGOGASTRODUODENOSCOPY (EGD);  Surgeon: Daneil Dolin, MD;  Location: [facility];  Service: Endoscopy;  Laterality: N/A;  . HERNIA REPAIR    . NASAL SEPTOPLASTY W/ TURBINOPLASTY Bilateral 01/12/2014   Procedure: BILATERAL TURBINATE RESECTION/SEPTOPLASTY ;  Surgeon: Ascencion Dike, MD;  Location: [place];  Service: ENT;  Laterality: Bilateral;  . SPLENECTOMY  1996   along with colon resection  . TONSILLECTOMY     . UPPER GASTROINTESTINAL ENDOSCOPY     03/29/13    Family History  Problem Relation Age of Onset  . Heart attack Father   . Cancer Father        prostate cancer  . Heart disease Father   . Colon cancer Father    Social History:  reports that he has never smoked. He has never used smokeless tobacco. He reports that he does not drink alcohol or use drugs.  Allergies:  Allergies  Allergen Reactions  . Asa [Aspirin]     PT HAS PEPTIC ULCER  . Morphine And Related     hallucinations    Medications Prior to Admission  Medication Sig Dispense Refill  . acetaminophen (TYLENOL) 500 MG tablet Take 500 mg by mouth every 6 (six) hours as needed for moderate pain or headache.    . cholestyramine (QUESTRAN) 4 GM/DOSE powder Take 4 g by mouth daily.     . clopidogrel (PLAVIX) 75 MG tablet Take 1 tablet by mouth once daily (Patient taking differently: Take 75 mg by mouth at bedtime. ) 90 tablet 3  . diphenhydramine-acetaminophen (TYLENOL PM) 25-500 MG TABS tablet Take 1 tablet by mouth at bedtime as needed (sleep).    . Flax Oil-Fish Oil-Borage Oil (FISH-FLAX-BORAGE) CAPS Take 1 capsule by mouth daily.    . Probiotic Product (PROBIOTIC DAILY PO) Take 1 capsule by mouth daily.     . rosuvastatin (CRESTOR) 40 MG  tablet TAKE 1/2 (ONE-HALF) TABLET BY MOUTH ONCE DAILY AT BEDTIME (Patient taking differently: Take 20 mg by mouth at bedtime. ) 45 tablet 0  . nitroGLYCERIN (NITROSTAT) 0.4 MG SL tablet Place 1 tablet (0.4 mg total) under the tongue every 5 (five) minutes as needed for chest pain. 25 tablet 4  . pantoprazole (PROTONIX) 40 MG tablet TAKE 1 TABLET BY MOUTH ONCE DAILY BEFORE BREAKFAST (Patient not taking: Reported on 10/29/2018) 30 tablet 5    No results found for this or any previous visit (from the past 48 hour(s)). No results found.  ROS  Blood pressure 132/81, pulse 69, temperature 97.6 F (36.4 C), temperature source Axillary, resp. rate 13, height 6\' 1"  (1.854 m), weight 85.3 kg,  SpO2 98 %. Physical Exam  Constitutional: He appears well-developed and well-nourished.  HENT:  Mouth/Throat: Oropharynx is clear and moist.  Eyes: Conjunctivae are normal. No scleral icterus.  Neck: No thyromegaly present.  Cardiovascular: Normal rate, regular rhythm and normal heart sounds.  No murmur heard. Respiratory: Effort normal and breath sounds normal.  GI:  Abdomen is symmetrical.  There is long midline scar.  Abdomen is soft and nontender with no organomegaly or masses.  Musculoskeletal:        General: No edema.  Lymphadenopathy:    He has no cervical adenopathy.  Neurological: He is alert.  Skin: Skin is warm and dry.     Assessment/Plan History of colonic adenomas. Family history of CRC in first-degree relative. Surveillance colonoscopy  Hildred Laser, MD 11/05/2018, 11:46 AM

## 2018-11-05 NOTE — Op Note (Signed)
[facility] Patient Name: Samuel Tanner Procedure Date: 11/05/2018 11:37 AM MRN: [id number] Date of Birth: 1950-08-18 Attending MD: Hildred Laser , MD CSN: [id number] Age: 68 Admit Type: Outpatient Procedure:                Colonoscopy Indications:              High risk colon cancer surveillance: Personal                            history of colonic polyps Providers:                Hildred Laser, MD, Otis Peak B. Sharon Seller, RN, Nelma Rothman, Technician Referring MD:             Lemmie Evens Medicines:                Meperidine 50 mg IV, Midazolam 6 mg IV Complications:            No immediate complications. Estimated Blood Loss:     Estimated blood loss: none. Procedure:                Pre-Anesthesia Assessment:                           - Prior to the procedure, a History and Physical                            was performed, and patient medications and                            allergies were reviewed. The patient's tolerance of                            previous anesthesia was also reviewed. The risks                            and benefits of the procedure and the sedation                            options and risks were discussed with the patient.                            All questions were answered, and informed consent                            was obtained. Prior Anticoagulants: The patient                            last took Plavix (clopidogrel) 5 days prior to the                            procedure. ASA Grade Assessment: II - A patient  with mild systemic disease. After reviewing the                            risks and benefits, the patient was deemed in                            satisfactory condition to undergo the procedure.                           After obtaining informed consent, the colonoscope                            was passed under direct vision. Throughout the   procedure, the patient's blood pressure, pulse, and                            oxygen saturations were monitored continuously. The                            [id number] scope was introduced through                            the anus and advanced to the the cecum, identified                            by appendiceal orifice and ileocecal valve. The                            colonoscopy was performed without difficulty. The                            patient tolerated the procedure well. The quality                            of the bowel preparation was adequate. Scope In: [id number] Scope Out: 12:11:39 PM Scope Withdrawal Time: 0 hours 11 minutes 7 seconds  Total Procedure Duration: 0 hours 14 minutes 41 seconds  Findings:      The perianal and digital rectal examinations were normal.      There was evidence of a prior end-to-end colo-colonic anastomosis at 25       cm proximal to the anus. This was patent and was characterized by       healthy appearing mucosa. The anastomosis was traversed.      A single small-mouthed diverticulum was found in the sigmoid colon.      The exam was otherwise normal throughout the examined colon.      The retroflexed view of the distal rectum and anal verge was normal and       showed no anal or rectal abnormalities. Impression:               - Patent end-to-end colo-colonic anastomosis,                            characterized by healthy appearing mucosa.                           -  Diverticulosis in the sigmoid colon.                           - No specimens collected. Moderate Sedation:      Moderate (conscious) sedation was administered by the endoscopy nurse       and supervised by the endoscopist. The following parameters were       monitored: oxygen saturation, heart rate, blood pressure, CO2       capnography and response to care. Total physician intraservice time was       20 minutes. Recommendation:           - Patient has a  contact number available for                            emergencies. The signs and symptoms of potential                            delayed complications were discussed with the                            patient. Return to normal activities tomorrow.                            Written discharge instructions were provided to the                            patient.                           - Resume previous diet today.                           - Continue present medications.                           - Resume Plavix (clopidogrel) at prior dose today.                           - Repeat colonoscopy in 5 years for surveillance. Procedure Code(s):        --- Professional ---                           [phone], Colonoscopy, flexible; diagnostic, including                            collection of specimen(s) by brushing or washing,                            when performed (separate procedure)                           G0500, Moderate sedation services provided by the                            same physician or other qualified health care  professional performing a gastrointestinal                            endoscopic service that sedation supports,                            requiring the presence of an independent trained                            observer to assist in the monitoring of the                            patient's level of consciousness and physiological                            status; initial 15 minutes of intra-service time;                            patient age 27 years or older (additional time may                            be reported with [phone], as appropriate) Diagnosis Code(s):        --- Professional ---                           Z86.010, Personal history of colonic polyps                           Z98.0, Intestinal bypass and anastomosis status                           K57.30, Diverticulosis of large intestine without                             perforation or abscess without bleeding CPT copyright 2019 American Medical Association. All rights reserved. The codes documented in this report are preliminary and upon coder review may  be revised to meet current compliance requirements. Hildred Laser, MD Hildred Laser, MD 11/05/2018 12:22:40 PM This report has been signed electronically. Number of Addenda: 0

## 2018-11-05 NOTE — Discharge Instructions (Signed)
Resume usual medications including clopidogrel as before. Please call back on pantoprazole and take 40 mg by mouth 30 minutes before breakfast daily. Resume usual diet. No driving for 24 hours. Next colonoscopy in 5 years.  PATIENT INSTRUCTIONS POST-ANESTHESIA  IMMEDIATELY FOLLOWING SURGERY:  Do not drive or operate machinery for the first twenty four hours after surgery.  Do not make any important decisions for twenty four hours after surgery or while taking narcotic pain medications or sedatives.  If you develop intractable nausea and vomiting or a severe headache please notify your doctor immediately.  FOLLOW-UP:  Please make an appointment with your surgeon as instructed. You do not need to follow up with anesthesia unless specifically instructed to do so.  WOUND CARE INSTRUCTIONS (if applicable):  Keep a dry clean dressing on the anesthesia/puncture wound site if there is drainage.  Once the wound has quit draining you may leave it open to air.  Generally you should leave the bandage intact for twenty four hours unless there is drainage.  If the epidural site drains for more than 36-48 hours please call the anesthesia department.  QUESTIONS?:  Please feel free to call your physician or the hospital operator if you have any questions, and they will be happy to assist you.      Colonoscopy, Adult, Care After This sheet gives you information about how to care for yourself after your procedure. Your doctor may also give you more specific instructions. If you have problems or questions, call your doctor. What can I expect after the procedure? After the procedure, it is common to have:  A small amount of blood in your poop for 24 hours.  Some gas.  Mild cramping or bloating in your belly. Follow these instructions at home: General instructions  For the first 24 hours after the procedure: ? Do not drive or use machinery. ? Do not sign important documents. ? Do not drink alcohol. ? Do  your daily activities more slowly than normal. ? Eat foods that are soft and easy to digest.  Take over-the-counter or prescription medicines only as told by your doctor. To help cramping and bloating:   Try walking around.  Put heat on your belly (abdomen) as told by your doctor. Use a heat source that your doctor recommends, such as a moist heat pack or a heating pad. ? Put a towel between your skin and the heat source. ? Leave the heat on for 20-30 minutes. ? Remove the heat if your skin turns bright red. This is especially important if you cannot feel pain, heat, or cold. You can get burned. Eating and drinking   Drink enough fluid to keep your pee (urine) clear or pale yellow.  Return to your normal diet as told by your doctor. Avoid heavy or fried foods that are hard to digest.  Avoid drinking alcohol for as long as told by your doctor. Contact a doctor if:  You have blood in your poop (stool) 2-3 days after the procedure. Get help right away if:  You have more than a small amount of blood in your poop.  You see large clumps of tissue (blood clots) in your poop.  Your belly is swollen.  You feel sick to your stomach (nauseous).  You throw up (vomit).  You have a fever.  You have belly pain that gets worse, and medicine does not help your pain. Summary  After the procedure, it is common to have a small amount of blood in your  poop. You may also have mild cramping and bloating in your belly.  For the first 24 hours after the procedure, do not drive or use machinery, do not sign important documents, and do not drink alcohol.  Get help right away if you have a lot of blood in your poop, feel sick to your stomach, have a fever, or have more belly pain. This information is not intended to replace advice given to you by your health care provider. Make sure you discuss any questions you have with your health care provider. Document Released: 02/03/2010 Document Revised:  11/01/2016 Document Reviewed: 09/26/2015 Elsevier Patient Education  2020 [employer].   Hemorrhoids Hemorrhoids are swollen veins that may develop:  In the butt (rectum). These are called internal hemorrhoids.  Around the opening of the butt (anus). These are called external hemorrhoids. Hemorrhoids can cause pain, itching, or bleeding. Most of the time, they do not cause serious problems. They usually get better with diet changes, lifestyle changes, and other home treatments. What are the causes? This condition may be caused by:  Having trouble pooping (constipation).  Pushing hard (straining) to poop.  Watery poop (diarrhea).  Pregnancy.  Being very overweight (obese).  Sitting for long periods of time.  Heavy lifting or other activity that causes you to strain.  Anal sex.  Riding a bike for a long period of time. What are the signs or symptoms? Symptoms of this condition include:  Pain.  Itching or soreness in the butt.  Bleeding from the butt.  Leaking poop.  Swelling in the area.  One or more lumps around the opening of your butt. How is this diagnosed? A doctor can often diagnose this condition by looking at the affected area. The doctor may also:  Do an exam that involves feeling the area with a gloved hand (digital rectal exam).  Examine the area inside your butt using a small tube (anoscope).  Order blood tests. This may be done if you have lost a lot of blood.  Have you get a test that involves looking inside the colon using a flexible tube with a camera on the end (sigmoidoscopy or colonoscopy). How is this treated? This condition can usually be treated at home. Your doctor may tell you to change what you eat, make lifestyle changes, or try home treatments. If these do not help, procedures can be done to remove the hemorrhoids or make them smaller. These may involve:  Placing rubber bands at the base of the hemorrhoids to cut off their blood  supply.  Injecting medicine into the hemorrhoids to shrink them.  Shining a type of light energy onto the hemorrhoids to cause them to fall off.  Doing surgery to remove the hemorrhoids or cut off their blood supply. Follow these instructions at home: Eating and drinking   Eat foods that have a lot of fiber in them. These include whole grains, beans, nuts, fruits, and vegetables.  Ask your doctor about taking products that have added fiber (fibersupplements).  Reduce the amount of fat in your diet. You can do this by: ? Eating low-fat dairy products. ? Eating less red meat. ? Avoiding processed foods.  Drink enough fluid to keep your pee (urine) pale yellow. Managing pain and swelling   Take a warm-water bath (sitz bath) for 20 minutes to ease pain. Do this 3-4 times a day. You may do this in a bathtub or using a portable sitz bath that fits over the toilet.  If told,  put ice on the painful area. It may be helpful to use ice between your warm baths. ? Put ice in a plastic bag. ? Place a towel between your skin and the bag. ? Leave the ice on for 20 minutes, 2-3 times a day. General instructions  Take over-the-counter and prescription medicines only as told by your doctor. ? Medicated creams and medicines may be used as told.  Exercise often. Ask your doctor how much and what kind of exercise is best for you.  Go to the bathroom when you have the urge to poop. Do not wait.  Avoid pushing too hard when you poop.  Keep your butt dry and clean. Use wet toilet paper or moist towelettes after pooping.  Do not sit on the toilet for a long time.  Keep all follow-up visits as told by your doctor. This is important. Contact a doctor if you:  Have pain and swelling that do not get better with treatment or medicine.  Have trouble pooping.  Cannot poop.  Have pain or swelling outside the area of the hemorrhoids. Get help right away if you have:  Bleeding that will not  stop. Summary  Hemorrhoids are swollen veins in the butt or around the opening of the butt.  They can cause pain, itching, or bleeding.  Eat foods that have a lot of fiber in them. These include whole grains, beans, nuts, fruits, and vegetables.  Take a warm-water bath (sitz bath) for 20 minutes to ease pain. Do this 3-4 times a day. This information is not intended to replace advice given to you by your health care provider. Make sure you discuss any questions you have with your health care provider. Document Released: 10/11/2007 Document Revised: 01/09/2018 Document Reviewed: 05/23/2017 Elsevier Patient Education  2020 [employer].

## 2018-11-11 ENCOUNTER — Encounter (HOSPITAL_COMMUNITY): Payer: Self-pay | Admitting: Internal Medicine

## 2019-02-04 ENCOUNTER — Telehealth: Payer: Self-pay | Admitting: Cardiology

## 2019-02-04 ENCOUNTER — Encounter: Payer: Self-pay | Admitting: *Deleted

## 2019-02-04 NOTE — Telephone Encounter (Signed)
Pt is needing a note so he can get his vaccine on Friday since he's on blood thinner

## 2019-02-04 NOTE — Telephone Encounter (Signed)
Pt notified that letter is ready for pick up

## 2019-02-06 ENCOUNTER — Ambulatory Visit: Payer: Medicare PPO | Attending: Internal Medicine

## 2019-02-06 DIAGNOSIS — Z23 Encounter for immunization: Secondary | ICD-10-CM | POA: Insufficient documentation

## 2019-02-06 NOTE — Progress Notes (Signed)
   Covid-[facility] Vaccination [facility]  Name:  Samuel Tanner    MRN: [id number] DOB: 09/05/1950  02/06/2019  Samuel Tanner was observed post Covid-19 immunization for 15 minutes without incidence. He was provided with Vaccine Information Sheet and instruction to access the V-Safe system.   Samuel Tanner was instructed to call 911 with any severe reactions post vaccine: Marland Kitchen Difficulty breathing  . Swelling of your face and throat  . A fast heartbeat  . A bad rash all over your body  . Dizziness and weakness    Immunizations Administered    Name Date Dose VIS Date Route   Pfizer COVID-19 Vaccine 02/06/2019  8:48 AM 0.3 mL 12/26/2018 Intramuscular   Manufacturer: [place]   Lot: [id number]   [place]: [id number]

## 2019-02-17 ENCOUNTER — Other Ambulatory Visit: Payer: Self-pay

## 2019-02-17 ENCOUNTER — Encounter: Payer: Self-pay | Admitting: Cardiology

## 2019-02-17 ENCOUNTER — Ambulatory Visit: Payer: Medicare PPO | Admitting: Cardiology

## 2019-02-17 VITALS — BP 108/63 | HR 74 | Temp 96.7°F | Ht 73.0 in | Wt 200.0 lb

## 2019-02-17 DIAGNOSIS — E782 Mixed hyperlipidemia: Secondary | ICD-10-CM

## 2019-02-17 DIAGNOSIS — I2510 Atherosclerotic heart disease of native coronary artery without angina pectoris: Secondary | ICD-10-CM | POA: Diagnosis not present

## 2019-02-17 NOTE — Patient Instructions (Signed)

## 2019-02-17 NOTE — Progress Notes (Signed)
Clinical Summary Samuel Tanner is a 69 y.o.male seen today for follow up of the following medical problems.  1. CAD  - prior stent to LCX  - last cath 2009 with patent stent, overall non-obstructive disease. LVEF at that time showed normal LV function.  - from prior notes developed peptic ulcer of ASA 81mg  daily, previous admit with GI bleeding thought secondary to NSAID use (aleve). Has been on plavix for secondary prevention.Soft bp's prohibit ACE-I or beta blocker - exercise stress nucler 06/2013, no ischemia with Duke treadmill score of 10.   - no recent chest pain - no sob or doe - compliannt with meds  2. Hyperlipidemia  -recent labs with pcp - remains compliant with statin.    3. OSAscreen - + snoring. + daytime somnolence - has not been intersted in sleep study   Past Medical History:  Diagnosis Date  . Appendiceal carcinoid tumor    resection  15 yrs ago  . Barrett esophagus 03/29/13   Preformed by Dr.Rourk  . Chest tightness, discomfort, or pressure   . Coronary artery disease   . Hyperlipidemia    mixed  . MI (myocardial infarction) ([place])   . Ulcer      Allergies  Allergen Reactions  . Asa [Aspirin]     PT HAS PEPTIC ULCER  . Morphine And Related     hallucinations     Current Outpatient Medications  Medication Sig Dispense Refill  . acetaminophen (TYLENOL) 500 MG tablet Take 500 mg by mouth every 6 (six) hours as needed for moderate pain or headache.    . cholestyramine (QUESTRAN) 4 GM/DOSE powder Take 4 g by mouth daily.     . clopidogrel (PLAVIX) 75 MG tablet Take 1 tablet by mouth once daily (Patient taking differently: Take 75 mg by mouth at bedtime. ) 90 tablet 3  . diphenhydramine-acetaminophen (TYLENOL PM) 25-500 MG TABS tablet Take 1 tablet by mouth at bedtime as needed (sleep).    . Flax Oil-Fish Oil-Borage Oil (FISH-FLAX-BORAGE) CAPS Take 1 capsule by mouth daily.    . nitroGLYCERIN (NITROSTAT) 0.4 MG SL tablet Place 1  tablet (0.4 mg total) under the tongue every 5 (five) minutes as needed for chest pain. 25 tablet 4  . pantoprazole (PROTONIX) 40 MG tablet Take 1 tablet (40 mg total) by mouth daily before breakfast. 90 tablet 3  . Probiotic Product (PROBIOTIC DAILY PO) Take 1 capsule by mouth daily.     . rosuvastatin (CRESTOR) 40 MG tablet TAKE 1/2 (ONE-HALF) TABLET BY MOUTH ONCE DAILY AT BEDTIME (Patient taking differently: Take 20 mg by mouth at bedtime. ) 45 tablet 0   No current facility-administered medications for this visit.     Past Surgical History:  Procedure Laterality Date  .  appendiceal carcinoid   1996  . APPENDECTOMY  1996  . CARDIAC CATHETERIZATION     06/27/2007 and 09/17/2001  . CHOLECYSTECTOMY  1998  . COLONOSCOPY    . COLONOSCOPY N/A 07/30/2013   Procedure: COLONOSCOPY;  Surgeon: Samuel Houston, MD;  [facility];  Service: Endoscopy;  Laterality: N/A;  1030  . COLONOSCOPY N/A 11/05/2018   Procedure: COLONOSCOPY;  Surgeon: Samuel Houston, MD;  [facility];  Service: Endoscopy;  Laterality: N/A;  1200  . ESOPHAGOGASTRODUODENOSCOPY N/A 03/29/2013   Procedure: ESOPHAGOGASTRODUODENOSCOPY (EGD);  Surgeon: Samuel Dolin, MD;  [facility];  Service: Endoscopy;  Laterality: N/A;  . HERNIA REPAIR    .  NASAL SEPTOPLASTY W/ TURBINOPLASTY Bilateral 01/12/2014   Procedure: BILATERAL TURBINATE RESECTION/SEPTOPLASTY ;  Surgeon: Samuel Dike, MD;  Location: [place];  Service: ENT;  Laterality: Bilateral;  . SPLENECTOMY  1996   along with colon resection  . TONSILLECTOMY    . UPPER GASTROINTESTINAL ENDOSCOPY     03/29/13     Allergies  Allergen Reactions  . Asa [Aspirin]     PT HAS PEPTIC ULCER  . Morphine And Related     hallucinations      Family History  Problem Relation Age of Onset  . Heart attack Father   . Cancer Father        prostate cancer  . Heart disease Father   . Colon cancer Father      Social History [name] reports that he has never smoked. He has never used smokeless tobacco. Samuel Tanner reports no history of alcohol use.   Review of Systems CONSTITUTIONAL: No weight loss, fever, chills, weakness or fatigue.  HEENT: Eyes: No visual loss, blurred vision, double vision or yellow sclerae.No hearing loss, sneezing, congestion, runny nose or sore throat.  SKIN: No rash or itching.  CARDIOVASCULAR: per hpi RESPIRATORY: No shortness of breath, cough or sputum.  GASTROINTESTINAL: No anorexia, nausea, vomiting or diarrhea. No abdominal pain or blood.  GENITOURINARY: No burning on urination, no polyuria NEUROLOGICAL: No headache, dizziness, syncope, paralysis, ataxia, numbness or tingling in the extremities. No change in bowel or bladder control.  MUSCULOSKELETAL: No muscle, back pain, joint pain or stiffness.  LYMPHATICS: No enlarged nodes. No history of splenectomy.  PSYCHIATRIC: No history of depression or anxiety.  ENDOCRINOLOGIC: No reports of sweating, cold or heat intolerance. No polyuria or polydipsia.  Marland Kitchen   Physical Examination Today's Vitals   02/17/19 0837  BP: 108/63  Pulse: 74  Temp: (!) 96.7 F (35.9 C)  SpO2: 99%  Weight: 200 lb (90.7 kg)  Height: 6\' 1"  (1.854 m)   Body mass index is 26.39 kg/m.  Gen: resting comfortably, no acute distress HEENT: no scleral icterus, pupils equal round and reactive, no palptable cervical adenopathy,  CV: RRR, no m/r/g, no jvd Resp: Clear to auscultation bilaterally GI: abdomen is soft, non-tender, non-distended, normal bowel sounds, no hepatosplenomegaly MSK: extremities are warm, no edema.  Skin: warm, no rash Neuro:  no focal deficits Psych: appropriate affect   Diagnostic Studies  06/2007 Cath HEMODYNAMIC DATA:  1. Central aortic pressure was 118/78.  2. Left ventricular pressure 104/9.  3. There was no gradient on pullback across the aortic valve.  ANGIOGRAPHIC DATA:  1. Ventriculography done in the RAO projection  reveals vigorous global  systolic function. No segmental abnormalities are identified.  There was a small inferior diverticulum, and this had been noted on  the previous angiographic study.  2. The left main is free of critical disease.  3. The LAD has a clear-cut fairly focal mid stenosis of about 40%. It  does not appear to be high-grade or flow-limiting. The vessel then  goes distally to the apex where there was mild luminal irregularity  after the diagonal. The diagonal itself is a small bifurcating  vessel without critical narrowing.  4. The circumflex provides a large marginal Arthea Nobel that has been  stented. There is a perhaps about 20% narrowing in the midportion  of the stent which corresponds to the area seen on intravascular  ultrasound. Distally the vessel trifurcates. It does not appear  to be flow-limiting.  5. The right  coronary artery has mild luminal irregularity but no  significant focal stenosis.  6. Intravascular ultrasound was performed. There was good apposition  throughout. The overall minimum lumen diameter appeared to be  appropriate for the stent being between 3.25 and 3.4 throughout  most of the stent. In the midportion of the stent, there is a very  small filling defect that could represent tissue prolapse. Its  density is that of some plaque, although a small amount of layering  thrombus cannot be excluded.  CONCLUSION:  1. Preserved left ventricular function.  2. Mild disease of the left anterior descending artery.  3. Normal-appearing right coronary artery.  4. Continued patency of the previously placed stent with a small area  of filling defect possibly representing small area of plaque,  tissue prolapse, and/or thrombus.  DISPOSITION: The patient will be treated medically. We will increase  his Plavix to b.i.d. for about 1 week, then resume his normal amount.  We will need to monitor his symptoms prospectively.    06/2013 MPI  IMPRESSION:  1. Negative exercise MPI for ischemia  2. Negative stress EKG for ischemia  3. Normal left ventricular systolic function, LVEF [id number]  4. Duke treadmill score of 10, consistent with low risk for major  cardiac events  5. Exceptional exercise functional capacity (>150% of predicted  based on age and gender)   Assessment and Plan  1. CAD  - on plavix for secondary prevention due to GI issues on ASA - not on ACE-I or beta blocker due to soft bp's - no recent symptoms, continue current meds - EKG today shows SR, rare PVCs. PVCs are asymptomatic.   2. Hyperlipidemia  - request pcp labs, continue statin.    F/u 1 year        Arnoldo Lenis, M.D.

## 2019-02-26 ENCOUNTER — Ambulatory Visit: Payer: Medicare PPO | Attending: Internal Medicine

## 2019-02-26 DIAGNOSIS — Z23 Encounter for immunization: Secondary | ICD-10-CM | POA: Insufficient documentation

## 2019-02-26 NOTE — Progress Notes (Signed)
   Covid-19 Vaccination Clinic  Name:  Samuel Tanner    MRN: [id number] DOB: [birth year]  02/26/2019  [name] was observed post Covid-19 immunization for 15 minutes without incidence. He was provided with Vaccine Information Sheet and instruction to access the V-Safe system.   [name] was instructed to call 911 with any severe reactions post vaccine: Marland Kitchen Difficulty breathing  . Swelling of your face and throat  . A fast heartbeat  . A bad rash all over your body  . Dizziness and weakness    Immunizations Administered    Name Date Dose VIS Date Route   Pfizer COVID-19 Vaccine 02/26/2019  1:31 PM 0.3 mL 12/26/2018 Intramuscular   Manufacturer: [place]   Lot: [id number]   [place]: [id number]

## 2019-05-27 DIAGNOSIS — D225 Melanocytic nevi of trunk: Secondary | ICD-10-CM | POA: Diagnosis not present

## 2019-05-27 DIAGNOSIS — L821 Other seborrheic keratosis: Secondary | ICD-10-CM | POA: Diagnosis not present

## 2019-05-27 DIAGNOSIS — Z85828 Personal history of other malignant neoplasm of skin: Secondary | ICD-10-CM | POA: Diagnosis not present

## 2019-05-27 DIAGNOSIS — Z08 Encounter for follow-up examination after completed treatment for malignant neoplasm: Secondary | ICD-10-CM | POA: Diagnosis not present

## 2019-05-27 DIAGNOSIS — X32XXXA Exposure to sunlight, initial encounter: Secondary | ICD-10-CM | POA: Diagnosis not present

## 2019-05-27 DIAGNOSIS — Z1283 Encounter for screening for malignant neoplasm of skin: Secondary | ICD-10-CM | POA: Diagnosis not present

## 2019-05-27 DIAGNOSIS — L820 Inflamed seborrheic keratosis: Secondary | ICD-10-CM | POA: Diagnosis not present

## 2019-05-27 DIAGNOSIS — L570 Actinic keratosis: Secondary | ICD-10-CM | POA: Diagnosis not present

## 2019-05-27 DIAGNOSIS — C4441 Basal cell carcinoma of skin of scalp and neck: Secondary | ICD-10-CM | POA: Diagnosis not present

## 2019-06-26 DIAGNOSIS — R197 Diarrhea, unspecified: Secondary | ICD-10-CM | POA: Diagnosis not present

## 2019-06-29 DIAGNOSIS — I2510 Atherosclerotic heart disease of native coronary artery without angina pectoris: Secondary | ICD-10-CM | POA: Diagnosis not present

## 2019-06-29 DIAGNOSIS — D72829 Elevated white blood cell count, unspecified: Secondary | ICD-10-CM | POA: Diagnosis not present

## 2019-06-29 DIAGNOSIS — Z0001 Encounter for general adult medical examination with abnormal findings: Secondary | ICD-10-CM | POA: Diagnosis not present

## 2019-06-29 DIAGNOSIS — K2270 Barrett's esophagus without dysplasia: Secondary | ICD-10-CM | POA: Diagnosis not present

## 2019-06-29 DIAGNOSIS — E782 Mixed hyperlipidemia: Secondary | ICD-10-CM | POA: Diagnosis not present

## 2019-06-29 DIAGNOSIS — J329 Chronic sinusitis, unspecified: Secondary | ICD-10-CM | POA: Diagnosis not present

## 2019-06-29 DIAGNOSIS — Z0000 Encounter for general adult medical examination without abnormal findings: Secondary | ICD-10-CM | POA: Diagnosis not present

## 2019-06-29 DIAGNOSIS — D696 Thrombocytopenia, unspecified: Secondary | ICD-10-CM | POA: Diagnosis not present

## 2019-06-29 DIAGNOSIS — L989 Disorder of the skin and subcutaneous tissue, unspecified: Secondary | ICD-10-CM | POA: Diagnosis not present

## 2019-06-30 DIAGNOSIS — Z85828 Personal history of other malignant neoplasm of skin: Secondary | ICD-10-CM | POA: Diagnosis not present

## 2019-06-30 DIAGNOSIS — Z08 Encounter for follow-up examination after completed treatment for malignant neoplasm: Secondary | ICD-10-CM | POA: Diagnosis not present

## 2019-07-06 DIAGNOSIS — D696 Thrombocytopenia, unspecified: Secondary | ICD-10-CM | POA: Diagnosis not present

## 2019-07-06 DIAGNOSIS — D72829 Elevated white blood cell count, unspecified: Secondary | ICD-10-CM | POA: Diagnosis not present

## 2019-07-06 DIAGNOSIS — Z0001 Encounter for general adult medical examination with abnormal findings: Secondary | ICD-10-CM | POA: Diagnosis not present

## 2019-07-06 DIAGNOSIS — J329 Chronic sinusitis, unspecified: Secondary | ICD-10-CM | POA: Diagnosis not present

## 2019-07-06 DIAGNOSIS — R197 Diarrhea, unspecified: Secondary | ICD-10-CM | POA: Diagnosis not present

## 2019-07-06 DIAGNOSIS — R7301 Impaired fasting glucose: Secondary | ICD-10-CM | POA: Diagnosis not present

## 2019-07-06 DIAGNOSIS — E782 Mixed hyperlipidemia: Secondary | ICD-10-CM | POA: Diagnosis not present

## 2019-07-06 DIAGNOSIS — K2270 Barrett's esophagus without dysplasia: Secondary | ICD-10-CM | POA: Diagnosis not present

## 2019-07-06 DIAGNOSIS — I2510 Atherosclerotic heart disease of native coronary artery without angina pectoris: Secondary | ICD-10-CM | POA: Diagnosis not present

## 2019-08-06 ENCOUNTER — Other Ambulatory Visit: Payer: Self-pay | Admitting: Cardiology

## 2019-08-13 DIAGNOSIS — H00019 Hordeolum externum unspecified eye, unspecified eyelid: Secondary | ICD-10-CM | POA: Diagnosis not present

## 2020-03-03 ENCOUNTER — Other Ambulatory Visit: Payer: Self-pay | Admitting: Cardiology

## 2020-05-02 ENCOUNTER — Other Ambulatory Visit: Payer: Self-pay | Admitting: Cardiology

## 2020-05-13 DIAGNOSIS — R7301 Impaired fasting glucose: Secondary | ICD-10-CM | POA: Diagnosis not present

## 2020-05-13 DIAGNOSIS — E782 Mixed hyperlipidemia: Secondary | ICD-10-CM | POA: Diagnosis not present

## 2020-05-18 DIAGNOSIS — E785 Hyperlipidemia, unspecified: Secondary | ICD-10-CM | POA: Diagnosis not present

## 2020-05-18 DIAGNOSIS — D75839 Thrombocytosis, unspecified: Secondary | ICD-10-CM | POA: Diagnosis not present

## 2020-05-18 DIAGNOSIS — K229 Disease of esophagus, unspecified: Secondary | ICD-10-CM | POA: Diagnosis not present

## 2020-05-18 DIAGNOSIS — I25119 Atherosclerotic heart disease of native coronary artery with unspecified angina pectoris: Secondary | ICD-10-CM | POA: Diagnosis not present

## 2020-05-18 DIAGNOSIS — R7301 Impaired fasting glucose: Secondary | ICD-10-CM | POA: Diagnosis not present

## 2020-05-18 DIAGNOSIS — R7303 Prediabetes: Secondary | ICD-10-CM | POA: Diagnosis not present

## 2020-05-18 DIAGNOSIS — D75838 Other thrombocytosis: Secondary | ICD-10-CM | POA: Diagnosis not present

## 2020-05-18 DIAGNOSIS — K2270 Barrett's esophagus without dysplasia: Secondary | ICD-10-CM | POA: Diagnosis not present

## 2020-05-18 DIAGNOSIS — R197 Diarrhea, unspecified: Secondary | ICD-10-CM | POA: Diagnosis not present

## 2020-06-03 ENCOUNTER — Ambulatory Visit: Payer: Medicare PPO | Admitting: Cardiology

## 2020-06-21 ENCOUNTER — Ambulatory Visit: Payer: Medicare Other | Admitting: Physician Assistant

## 2020-06-21 ENCOUNTER — Other Ambulatory Visit: Payer: Self-pay

## 2020-06-21 ENCOUNTER — Encounter: Payer: Self-pay | Admitting: Physician Assistant

## 2020-06-21 VITALS — BP 148/82 | HR 79 | Ht 73.0 in | Wt 204.0 lb

## 2020-06-21 DIAGNOSIS — R030 Elevated blood-pressure reading, without diagnosis of hypertension: Secondary | ICD-10-CM

## 2020-06-21 DIAGNOSIS — E782 Mixed hyperlipidemia: Secondary | ICD-10-CM

## 2020-06-21 DIAGNOSIS — I2510 Atherosclerotic heart disease of native coronary artery without angina pectoris: Secondary | ICD-10-CM | POA: Diagnosis not present
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
## 2020-06-21 NOTE — Patient Instructions (Signed)
Medication Instructions:  Your physician recommends that you continue on your current medications as directed. Please refer to the Current Medication list given to you today.  *If you need a refill on your cardiac medications before your next appointment, please call your pharmacy*   Lab Work: None today  If you have labs (blood work) drawn today and your tests are completely normal, you will receive your results only by: Marland Kitchen MyChart Message (if you have MyChart) OR . A paper copy in the mail If you have any lab test that is abnormal or we need to change your treatment, we will call you to review the results.   Testing/Procedures: None today    Follow-Up: At [facility], you and your health needs are our priority.  As part of our continuing mission to provide you with exceptional heart care, we have created designated Provider Care Teams.  These Care Teams include your primary Cardiologist (physician) and Advanced Practice Providers (APPs -  Physician Assistants and Nurse Practitioners) who all work together to provide you with the care you need, when you need it.  We recommend signing up for the patient portal called "MyChart".  Sign up information is provided on this After Visit Summary.  MyChart is used to connect with patients for Virtual Visits (Telemedicine).  Patients are able to view lab/test results, encounter notes, upcoming appointments, etc.  Non-urgent messages can be sent to your provider as well.   To learn more about what you can do with MyChart, go to [email or web address].    Your next appointment:   12 month(s)  The format for your next appointment:   In Person  Provider:   You may see Carlyle Dolly, MD    Other Instructions None

## 2020-06-22 NOTE — Addendum Note (Signed)
Addended by: Levonne Hubert on: 06/22/2020 08:02 AM   Modules accepted: Orders

## 2020-09-09 ENCOUNTER — Encounter: Payer: Self-pay | Admitting: Emergency Medicine

## 2020-09-09 ENCOUNTER — Other Ambulatory Visit: Payer: Self-pay

## 2020-09-09 ENCOUNTER — Ambulatory Visit
Admission: EM | Admit: 2020-09-09 | Discharge: 2020-09-09 | Disposition: A | Discharge status: Home / Self Care | Payer: Medicare Other | Attending: Emergency Medicine | Admitting: Emergency Medicine

## 2020-09-09 DIAGNOSIS — J069 Acute upper respiratory infection, unspecified: Secondary | ICD-10-CM | POA: Diagnosis not present

## 2020-09-09 MED ORDER — MOLNUPIRAVIR 200 MG PO CAPS: 800.0000 mg | ORAL_CAPSULE | Freq: Two times a day (BID) | ORAL | 0 refills | Status: AC

## 2020-09-09 NOTE — Discharge Instructions (Addendum)
You should remain isolated in your home for 5 days from symptom onset AND greater than 72 hours after symptoms resolution (absence of fever without the use of fever-reducing medication and improvement in respiratory symptoms), whichever is longer Get plenty of rest and push fluids Antiviral prescribed Use OTC zyrtec for nasal congestion, runny nose, and/or sore throat Use OTC flonase for nasal congestion and runny nose Use medications daily for symptom relief Use OTC medications like ibuprofen or tylenol as needed fever or pain Follow up with PCP for recheck Call or go to the ED if you have any new or worsening symptoms such as fever, worsening cough, shortness of breath, chest tightness, chest pain, turning blue, changes in mental status, etc..Marland Kitchen

## 2020-09-09 NOTE — ED Provider Notes (Signed)
[place]   CQ:[id number] 09/09/20 Arrival Time: 1900   CC: COVID symptoms  SUBJECTIVE: History from: patient.  DERONE MOUSTAFA is a 70 y.o. male who presents with body aches, sore throat, nasal congestion, and ear pain x 1 day.  Denies sick exposure to COVID, flu or strep.  Tested positive for covid.  Has tried OTC medications without relief.  Denis aggravating factors.  Denies previous covid infection in the past.   Denies fever, chills, SOB, wheezing, chest pain, nausea, changes in bowel or bladder habits.    ROS: As per HPI.  All other pertinent ROS negative.     Past Medical History:  Diagnosis Date   Appendiceal carcinoid tumor    resection  15 yrs ago   Barrett esophagus 03/29/13   Preformed by Dr.Rourk   Chest tightness, discomfort, or pressure    Coronary artery disease    Hyperlipidemia    mixed   MI (myocardial infarction) ([place])    Ulcer    Past Surgical History:  Procedure Laterality Date    appendiceal carcinoid   [place]     06/27/2007 and 09/17/2001   CHOLECYSTECTOMY  1998   COLONOSCOPY     COLONOSCOPY N/A 07/30/2013   Procedure: COLONOSCOPY;  Surgeon: Rogene Houston, MD;  [facility];  Service: Endoscopy;  Laterality: N/A;  1030   COLONOSCOPY N/A 11/05/2018   Procedure: COLONOSCOPY;  Surgeon: Rogene Houston, MD;  [facility];  Service: Endoscopy;  Laterality: N/A;  1200   ESOPHAGOGASTRODUODENOSCOPY N/A 03/29/2013   Procedure: ESOPHAGOGASTRODUODENOSCOPY (EGD);  Surgeon: Daneil Dolin, MD;  [facility];  Service: Endoscopy;  Laterality: N/A;   HERNIA REPAIR     NASAL SEPTOPLASTY W/ TURBINOPLASTY Bilateral 01/12/2014   Procedure: BILATERAL TURBINATE RESECTION/SEPTOPLASTY ;  Surgeon: Ascencion Dike, MD;  Location: [facility];  Service: ENT;  Laterality: Bilateral;   SPLENECTOMY  1996   along with colon resection   TONSILLECTOMY     UPPER GASTROINTESTINAL  ENDOSCOPY     03/29/13   Allergies  Allergen Reactions   Asa [Aspirin]     PT HAS PEPTIC ULCER   Morphine And Related     hallucinations   No current facility-administered medications on file prior to encounter.   Current Outpatient Medications on File Prior to Encounter  Medication Sig Dispense Refill   acetaminophen (TYLENOL) 500 MG tablet Take 500 mg by mouth every 6 (six) hours as needed for moderate pain or headache.     cholestyramine (QUESTRAN) 4 GM/DOSE powder Take 4 g by mouth daily.      clopidogrel (PLAVIX) 75 MG tablet Take 1 tablet by mouth once daily 90 tablet 1   diphenhydramine-acetaminophen (TYLENOL PM) 25-500 MG TABS tablet Take 1 tablet by mouth at bedtime as needed (sleep).     nitroGLYCERIN (NITROSTAT) 0.4 MG SL tablet Place 1 tablet (0.4 mg total) under the tongue every 5 (five) minutes as needed for chest pain. 25 tablet 4   pantoprazole (PROTONIX) 40 MG tablet Take 1 tablet (40 mg total) by mouth daily before breakfast. 90 tablet 3   Probiotic Product (PROBIOTIC DAILY PO) Take 1 capsule by mouth daily.      rosuvastatin (CRESTOR) 40 MG tablet TAKE 1/2 (ONE-HALF) TABLET BY MOUTH ONCE DAILY AT BEDTIME 45 tablet 0   Social History   Socioeconomic History   Marital status: Married    Spouse name:  Not on file   Number of children: Not on file   Years of education: Not on file   Highest education level: Not on file  Occupational History   Occupation: part time    Employer: RETIRED    Comment: pharmacist  Tobacco Use   Smoking status: Never   Smokeless tobacco: Never  Vaping Use   Vaping Use: Never used  Substance and Sexual Activity   Alcohol use: No    Alcohol/week: 0.0 standard drinks   Drug use: No   Sexual activity: Not on file  Other Topics Concern   Not on file  Social History Narrative   Not on file   Social Determinants of Health   Financial Resource Strain: Not on file  Food Insecurity: Not on file  Transportation Needs: Not on file   Physical Activity: Not on file  Stress: Not on file  Social Connections: Not on file  Intimate Partner Violence: Not on file   Family History  Problem Relation Age of Onset   Heart attack Father    Cancer Father        prostate cancer   Heart disease Father    Colon cancer Father     OBJECTIVE:  Vitals:   09/09/20 1913  BP: 118/71  Pulse: 78  Resp: 16  Temp: 98.6 F (37 C)  TempSrc: Oral  SpO2: 93%     General appearance: alert; appears fatigued, but nontoxic; speaking in full sentences and tolerating own secretions HEENT: NCAT; Ears: EACs clear, TMs pearly gray; Eyes: PERRL.  EOM grossly intact. Nose: nares patent without rhinorrhea, Throat: oropharynx clear, tonsils non erythematous or enlarged, uvula midline  Neck: supple without LAD Lungs: unlabored respirations, symmetrical air entry; cough: absent; no respiratory distress; CTAB Heart: regular rate and rhythm.   Skin: warm and dry Psychological: alert and cooperative; normal mood and affect  ASSESSMENT & PLAN:  1. Viral URI with cough     Meds ordered this encounter  Medications   Molnupiravir 200 MG CAPS    Sig: Take 4 capsules (800 mg total) by mouth in the morning and at bedtime for 5 days.    Dispense:  40 capsule    Refill:  0    Order Specific Question:   Supervising Provider    Answer:   Raylene Everts [id number]    You should remain isolated in your home for 5 days from symptom onset AND greater than 72 hours after symptoms resolution (absence of fever without the use of fever-reducing medication and improvement in respiratory symptoms), whichever is longer Get plenty of rest and push fluids Antiviral prescribed Use OTC zyrtec for nasal congestion, runny nose, and/or sore throat Use OTC flonase for nasal congestion and runny nose Use medications daily for symptom relief Use OTC medications like ibuprofen or tylenol as needed fever or pain Follow up with PCP for recheck Call or go to the ED  if you have any new or worsening symptoms such as fever, worsening cough, shortness of breath, chest tightness, chest pain, turning blue, changes in mental status, etc...   Reviewed expectations re: course of current medical issues. Questions answered. Outlined signs and symptoms indicating need for more acute intervention. Patient verbalized understanding. After Visit Summary given.          Lestine Box, PA-C 09/09/20 1932

## 2020-09-09 NOTE — ED Triage Notes (Signed)
Body aches, sore throat, nasal congestion and ear pain since last night.  Took home covid test today with a positive result

## 2020-09-26 ENCOUNTER — Other Ambulatory Visit: Payer: Self-pay | Admitting: Cardiology

## 2020-11-15 DIAGNOSIS — R7303 Prediabetes: Secondary | ICD-10-CM | POA: Diagnosis not present

## 2020-11-15 DIAGNOSIS — E785 Hyperlipidemia, unspecified: Secondary | ICD-10-CM | POA: Diagnosis not present

## 2020-11-22 DIAGNOSIS — D75839 Thrombocytosis, unspecified: Secondary | ICD-10-CM | POA: Diagnosis not present

## 2020-11-22 DIAGNOSIS — I25119 Atherosclerotic heart disease of native coronary artery with unspecified angina pectoris: Secondary | ICD-10-CM | POA: Diagnosis not present

## 2020-11-22 DIAGNOSIS — R7303 Prediabetes: Secondary | ICD-10-CM | POA: Diagnosis not present

## 2020-11-22 DIAGNOSIS — E785 Hyperlipidemia, unspecified: Secondary | ICD-10-CM | POA: Diagnosis not present

## 2020-11-22 DIAGNOSIS — R197 Diarrhea, unspecified: Secondary | ICD-10-CM | POA: Diagnosis not present

## 2020-11-22 DIAGNOSIS — Z23 Encounter for immunization: Secondary | ICD-10-CM | POA: Diagnosis not present

## 2020-11-22 DIAGNOSIS — Z0001 Encounter for general adult medical examination with abnormal findings: Secondary | ICD-10-CM | POA: Diagnosis not present

## 2020-11-22 DIAGNOSIS — K2270 Barrett's esophagus without dysplasia: Secondary | ICD-10-CM | POA: Diagnosis not present

## 2020-12-14 DIAGNOSIS — H43393 Other vitreous opacities, bilateral: Secondary | ICD-10-CM | POA: Diagnosis not present

## 2021-01-20 ENCOUNTER — Other Ambulatory Visit: Payer: Self-pay | Admitting: Cardiology

## 2021-03-08 DIAGNOSIS — J302 Other seasonal allergic rhinitis: Secondary | ICD-10-CM | POA: Diagnosis not present

## 2021-03-08 DIAGNOSIS — G4733 Obstructive sleep apnea (adult) (pediatric): Secondary | ICD-10-CM | POA: Diagnosis not present

## 2021-03-20 ENCOUNTER — Other Ambulatory Visit (HOSPITAL_BASED_OUTPATIENT_CLINIC_OR_DEPARTMENT_OTHER): Payer: Self-pay

## 2021-03-20 DIAGNOSIS — G4733 Obstructive sleep apnea (adult) (pediatric): Secondary | ICD-10-CM

## 2021-03-27 ENCOUNTER — Other Ambulatory Visit: Payer: Self-pay

## 2021-03-27 ENCOUNTER — Ambulatory Visit: Payer: Medicare Other | Attending: Nurse Practitioner | Admitting: Neurology

## 2021-03-27 DIAGNOSIS — G4733 Obstructive sleep apnea (adult) (pediatric): Secondary | ICD-10-CM | POA: Diagnosis not present

## 2021-03-28 DIAGNOSIS — G4733 Obstructive sleep apnea (adult) (pediatric): Secondary | ICD-10-CM | POA: Diagnosis not present

## 2021-03-29 ENCOUNTER — Other Ambulatory Visit (INDEPENDENT_AMBULATORY_CARE_PROVIDER_SITE_OTHER): Payer: Self-pay | Admitting: Internal Medicine

## 2021-04-06 NOTE — Procedures (Signed)
? ?  [place] ?Alexxander Kurt A. Merlene Laughter, MD     [email or web address] ?       ? ?     HOME SLEEP STUDY  ? ?LOCATION: [facility] ? ?Patient Name: Samuel Tanner, Samuel Tanner ?Study Date: 03/28/2021 ?Gender: Male ?D.O.B: 07/19/1950 ?Age (years): 20 ?Referring Provider: Not Available ?Height (inches): 73 ?Interpreting Physician: Phillips Odor MD, ABSM ?Weight (lbs): 204 ?RPSGT: Rosebud Poles ?BMI: 27 ?MRN: [id number] ?Neck Size: ?CLINICAL INFORMATION ?Sleep Study Type: HST ? ?  ? ?Indication for sleep study: N/A ? ?  ? ?Epworth Sleepiness Score: N/A ? ?SLEEP STUDY TECHNIQUE ?A multi-channel overnight portable sleep study was performed. The channels recorded were: nasal airflow, thoracic respiratory movement, and oxygen saturation with a pulse oximetry. Snoring was also monitored. ? ?MEDICATIONS ?Patient self administered medications include: N/A. ? ?Current Outpatient Medications:  ?  acetaminophen (TYLENOL) 500 MG tablet, Take 500 mg by mouth every 6 (six) hours as needed for moderate pain or headache., Disp: , Rfl:  ?  cholestyramine (QUESTRAN) 4 GM/DOSE powder, Take 4 g by mouth daily. , Disp: , Rfl:  ?  clopidogrel (PLAVIX) 75 MG tablet, Take 1 tablet by mouth once daily, Disp: 90 tablet, Rfl: 2 ?  diphenhydramine-acetaminophen (TYLENOL PM) 25-500 MG TABS tablet, Take 1 tablet by mouth at bedtime as needed (sleep)., Disp: , Rfl:  ?  nitroGLYCERIN (NITROSTAT) 0.4 MG SL tablet, Place 1 tablet (0.4 mg total) under the tongue every 5 (five) minutes as needed for chest pain., Disp: 25 tablet, Rfl: 4 ?  pantoprazole (PROTONIX) 40 MG tablet, Take 1 tablet (40 mg total) by mouth daily before breakfast., Disp: 90 tablet, Rfl: 3 ?  Probiotic Product (PROBIOTIC DAILY PO), Take 1 capsule by mouth daily. , Disp: , Rfl:  ?  rosuvastatin (CRESTOR) 40 MG tablet, TAKE 1/2 (ONE-HALF) TABLET BY MOUTH ONCE DAILY AT BEDTIME, Disp: 45 tablet, Rfl: 3 ? ? ?SLEEP ARCHITECTURE ?Patient was studied for 386.7 minutes. The sleep efficiency was 95.1 %  and the patient was supine for 86.6%. The arousal index was 0.0 per hour. ? ?RESPIRATORY PARAMETERS ?The overall AHI was 40.7 per hour, with a central apnea index of 0 per hour. ? ?The oxygen nadir was 80% during sleep. ? ?  ? ?CARDIAC DATA ?Mean heart rate during sleep was 57.9 bpm. ? ?IMPRESSIONS ?Severe obstructive sleep apnea occurred during this study (AHI = 40.7/h).  Auto PAP 8-14 is recommended. ? ? ?Delano Metz, MD ?Diplomate, American Board of Sleep Medicine. ? ?ELECTRONICALLY SIGNED ON:  04/06/2021, 7:47 PM ?[place] ?PH: [phone]   FX: [phone] ?ACCREDITED BY THE AMERICAN ACADEMY OF SLEEP MEDICINE ? ?

## 2021-05-18 DIAGNOSIS — E785 Hyperlipidemia, unspecified: Secondary | ICD-10-CM | POA: Diagnosis not present

## 2021-05-18 DIAGNOSIS — R7303 Prediabetes: Secondary | ICD-10-CM | POA: Diagnosis not present

## 2021-05-23 DIAGNOSIS — D75839 Thrombocytosis, unspecified: Secondary | ICD-10-CM | POA: Diagnosis not present

## 2021-05-23 DIAGNOSIS — I25119 Atherosclerotic heart disease of native coronary artery with unspecified angina pectoris: Secondary | ICD-10-CM | POA: Diagnosis not present

## 2021-05-23 DIAGNOSIS — K2270 Barrett's esophagus without dysplasia: Secondary | ICD-10-CM | POA: Diagnosis not present

## 2021-05-23 DIAGNOSIS — G4730 Sleep apnea, unspecified: Secondary | ICD-10-CM | POA: Diagnosis not present

## 2021-05-23 DIAGNOSIS — R197 Diarrhea, unspecified: Secondary | ICD-10-CM | POA: Diagnosis not present

## 2021-05-23 DIAGNOSIS — E785 Hyperlipidemia, unspecified: Secondary | ICD-10-CM | POA: Diagnosis not present

## 2021-05-23 DIAGNOSIS — R7303 Prediabetes: Secondary | ICD-10-CM | POA: Diagnosis not present

## 2021-05-25 DIAGNOSIS — G4733 Obstructive sleep apnea (adult) (pediatric): Secondary | ICD-10-CM | POA: Diagnosis not present

## 2021-06-25 DIAGNOSIS — G4733 Obstructive sleep apnea (adult) (pediatric): Secondary | ICD-10-CM | POA: Diagnosis not present

## 2021-07-25 DIAGNOSIS — G4733 Obstructive sleep apnea (adult) (pediatric): Secondary | ICD-10-CM | POA: Diagnosis not present

## 2021-07-26 DIAGNOSIS — G4730 Sleep apnea, unspecified: Secondary | ICD-10-CM | POA: Diagnosis not present

## 2021-07-31 ENCOUNTER — Encounter: Payer: Self-pay | Admitting: Cardiology

## 2021-07-31 ENCOUNTER — Ambulatory Visit (INDEPENDENT_AMBULATORY_CARE_PROVIDER_SITE_OTHER): Payer: Medicare Other | Admitting: Cardiology

## 2021-07-31 VITALS — BP 138/78 | HR 57 | Ht 73.0 in | Wt 212.0 lb

## 2021-07-31 DIAGNOSIS — I2510 Atherosclerotic heart disease of native coronary artery without angina pectoris: Secondary | ICD-10-CM | POA: Diagnosis not present

## 2021-07-31 DIAGNOSIS — I2584 Coronary atherosclerosis due to calcified coronary lesion: Secondary | ICD-10-CM | POA: Diagnosis not present

## 2021-07-31 DIAGNOSIS — E782 Mixed hyperlipidemia: Secondary | ICD-10-CM

## 2021-07-31 NOTE — Progress Notes (Signed)
Clinical Summary [name] is a 71 y.o.male seen today for follow up of the following medical problems.     1. CAD   - prior stent to LCX   - last cath 2009 with patent stent, overall non-obstructive disease. LVEF at that time showed normal LV function.   - from prior notes developed peptic ulcer of ASA '81mg'$  daily, previous admit with GI bleeding thought secondary to NSAID use (aleve).  Has been on plavix for secondary prevention. Soft bp's prohibit ACE-I or beta blocker -  exercise stress nucler 06/2013, no ischemia with Duke treadmill score of 10.     - no chest pains, no SOB/DOe -compliant with meds   2. Hyperlipidemia   - recent labs with pcp - remains compliant with statin.    - recent labs with pcp   3. OSA screen - + snoring. + daytime somnolence - has not been intersted in sleep study  4. Elevated bp -bp mildly elevated here, at recent pcp visit 132/80    Past Medical History:  Diagnosis Date   Appendiceal carcinoid tumor    resection  15 yrs ago   Barrett esophagus 03/29/13   Preformed by Dr.Rourk   Chest tightness, discomfort, or pressure    Coronary artery disease    Hyperlipidemia    mixed   MI (myocardial infarction) ([facility])    Ulcer      Allergies  Allergen Reactions   Asa [Aspirin]     PT HAS PEPTIC ULCER   Morphine And Related     hallucinations     Current Outpatient Medications  Medication Sig Dispense Refill   acetaminophen (TYLENOL) 500 MG tablet Take 500 mg by mouth every 6 (six) hours as needed for moderate pain or headache.     cholestyramine (QUESTRAN) 4 GM/DOSE powder Take 4 g by mouth daily.      clopidogrel (PLAVIX) 75 MG tablet Take 1 tablet by mouth once daily 90 tablet 2   diphenhydramine-acetaminophen (TYLENOL PM) 25-500 MG TABS tablet Take 1 tablet by mouth at bedtime as needed (sleep).     nitroGLYCERIN (NITROSTAT) 0.4 MG SL tablet Place 1 tablet (0.4 mg total) under the tongue every 5 (five) minutes as needed for chest  pain. 25 tablet 4   pantoprazole (PROTONIX) 40 MG tablet Take 1 tablet (40 mg total) by mouth daily before breakfast. 90 tablet 3   Probiotic Product (PROBIOTIC DAILY PO) Take 1 capsule by mouth daily.      rosuvastatin (CRESTOR) 40 MG tablet TAKE 1/2 (ONE-HALF) TABLET BY MOUTH ONCE DAILY AT BEDTIME 45 tablet 3   No current facility-administered medications for this visit.     Past Surgical History:  Procedure Laterality Date    appendiceal carcinoid   [place]     06/27/2007 and 09/17/2001   CHOLECYSTECTOMY  1998   COLONOSCOPY     COLONOSCOPY N/A 07/30/2013   Procedure: COLONOSCOPY;  Surgeon: Rogene Houston, MD;  [facility];  Service: Endoscopy;  Laterality: N/A;  1030   COLONOSCOPY N/A 11/05/2018   Procedure: COLONOSCOPY;  Surgeon: Rogene Houston, MD;  [facility];  Service: Endoscopy;  Laterality: N/A;  1200   ESOPHAGOGASTRODUODENOSCOPY N/A 03/29/2013   Procedure: ESOPHAGOGASTRODUODENOSCOPY (EGD);  Surgeon: Daneil Dolin, MD;  [facility];  Service: Endoscopy;  Laterality: N/A;   HERNIA REPAIR     NASAL SEPTOPLASTY W/ TURBINOPLASTY Bilateral 01/12/2014  Procedure: BILATERAL TURBINATE RESECTION/SEPTOPLASTY ;  Surgeon: Ascencion Dike, MD;  Location: [facility];  Service: ENT;  Laterality: Bilateral;   SPLENECTOMY  1996   along with colon resection   TONSILLECTOMY     UPPER GASTROINTESTINAL ENDOSCOPY     03/29/13     Allergies  Allergen Reactions   Asa [Aspirin]     PT HAS PEPTIC ULCER   Morphine And Related     hallucinations      Family History  Problem Relation Age of Onset   Heart attack Father    Cancer Father        prostate cancer   Heart disease Father    Colon cancer Father      Social History [name] reports that he has never smoked. He has never used smokeless tobacco. [name] reports no history of alcohol use.   Review of Systems CONSTITUTIONAL: No  weight loss, fever, chills, weakness or fatigue.  HEENT: Eyes: No visual loss, blurred vision, double vision or yellow sclerae.No hearing loss, sneezing, congestion, runny nose or sore throat.  SKIN: No rash or itching.  CARDIOVASCULAR: per hpi RESPIRATORY: No shortness of breath, cough or sputum.  GASTROINTESTINAL: No anorexia, nausea, vomiting or diarrhea. No abdominal pain or blood.  GENITOURINARY: No burning on urination, no polyuria NEUROLOGICAL: No headache, dizziness, syncope, paralysis, ataxia, numbness or tingling in the extremities. No change in bowel or bladder control.  MUSCULOSKELETAL: No muscle, back pain, joint pain or stiffness.  LYMPHATICS: No enlarged nodes. No history of splenectomy.  PSYCHIATRIC: No history of depression or anxiety.  ENDOCRINOLOGIC: No reports of sweating, cold or heat intolerance. No polyuria or polydipsia.  Marland Kitchen   Physical Examination Today's Vitals   07/31/21 1557  BP: 138/78  Pulse: (!) 57  SpO2: 96%  Weight: 212 lb (96.2 kg)  Height: '6\' 1"'$  (1.854 m)   Body mass index is 27.97 kg/m.  Gen: resting comfortably, no acute distress HEENT: no scleral icterus, pupils equal round and reactive, no palptable cervical adenopathy,  CV: RRR, no m/r/g no jvd Resp: Clear to auscultation bilaterally GI: abdomen is soft, non-tender, non-distended, normal bowel sounds, no hepatosplenomegaly MSK: extremities are warm, no edema.  Skin: warm, no rash Neuro:  no focal deficits Psych: appropriate affect   Diagnostic Studies  06/2007 Cath   HEMODYNAMIC DATA:   1. Central aortic pressure was 118/78.   2. Left ventricular pressure 104/9.   3. There was no gradient on pullback across the aortic valve.   ANGIOGRAPHIC DATA:   1. Ventriculography done in the RAO projection reveals vigorous global   systolic function. No segmental abnormalities are identified.   There was a small inferior diverticulum, and this had been noted on   the previous angiographic  study.   2. The left main is free of critical disease.   3. The LAD has a clear-cut fairly focal mid stenosis of about 40%. It   does not appear to be high-grade or flow-limiting. The vessel then   goes distally to the apex where there was mild luminal irregularity   after the diagonal. The diagonal itself is a small bifurcating   vessel without critical narrowing.   4. The circumflex provides a large marginal Samuel Tanner that has been   stented. There is a perhaps about 20% narrowing in the midportion   of the stent which corresponds to the area seen on intravascular   ultrasound. Distally the vessel trifurcates. It does not appear  to be flow-limiting.   5. The right coronary artery has mild luminal irregularity but no   significant focal stenosis.   6. Intravascular ultrasound was performed. There was good apposition   throughout. The overall minimum lumen diameter appeared to be   appropriate for the stent being between 3.25 and 3.4 throughout   most of the stent. In the midportion of the stent, there is a very   small filling defect that could represent tissue prolapse. Its   density is that of some plaque, although a small amount of layering   thrombus cannot be excluded.   CONCLUSION:   1. Preserved left ventricular function.   2. Mild disease of the left anterior descending artery.   3. Normal-appearing right coronary artery.   4. Continued patency of the previously placed stent with a small area   of filling defect possibly representing small area of plaque,   tissue prolapse, and/or thrombus.   DISPOSITION: The patient will be treated medically. We will increase   his Plavix to b.i.d. for about 1 week, then resume his normal amount.   We will need to monitor his symptoms prospectively.    06/2013 MPI   IMPRESSION:   1. Negative exercise MPI for ischemia   2. Negative stress EKG for ischemia   3. Normal left ventricular systolic function, LVEF 05%   4. Duke treadmill score  of 10, consistent with low risk for major   cardiac events   5. Exceptional exercise functional capacity (>150% of predicted   based on age and gender)     Assessment and Plan  1. CAD   - on plavix for secondary prevention due to GI issues on ASA - not on ACE-I or beta blocker due to prior soft bp's - no symptoms, continue current meds EKG SR, no acute ischemic changes   2. Hyperlipidemia   - continue current meds, request labs from pcp      Arnoldo Samuel Tanner, M.D.

## 2021-07-31 NOTE — Patient Instructions (Signed)
Medication Instructions:  Your physician recommends that you continue on your current medications as directed. Please refer to the Current Medication list given to you today.   Labwork: None  Testing/Procedures: None  Follow-Up: Follow up with Dr. Branch in 1 year.   Any Other Special Instructions Will Be Listed Below (If Applicable).     If you need a refill on your cardiac medications before your next appointment, please call your pharmacy.  

## 2021-08-15 ENCOUNTER — Encounter: Payer: Self-pay | Admitting: Internal Medicine

## 2021-08-25 DIAGNOSIS — G4733 Obstructive sleep apnea (adult) (pediatric): Secondary | ICD-10-CM | POA: Diagnosis not present

## 2021-09-25 DIAGNOSIS — G4733 Obstructive sleep apnea (adult) (pediatric): Secondary | ICD-10-CM | POA: Diagnosis not present

## 2021-10-25 DIAGNOSIS — G4733 Obstructive sleep apnea (adult) (pediatric): Secondary | ICD-10-CM | POA: Diagnosis not present

## 2021-11-09 ENCOUNTER — Other Ambulatory Visit: Payer: Self-pay | Admitting: Cardiology

## 2021-11-25 DIAGNOSIS — G4733 Obstructive sleep apnea (adult) (pediatric): Secondary | ICD-10-CM | POA: Diagnosis not present

## 2021-12-18 DIAGNOSIS — H43393 Other vitreous opacities, bilateral: Secondary | ICD-10-CM | POA: Diagnosis not present

## 2021-12-25 DIAGNOSIS — G4733 Obstructive sleep apnea (adult) (pediatric): Secondary | ICD-10-CM | POA: Diagnosis not present

## 2022-01-25 DIAGNOSIS — G4733 Obstructive sleep apnea (adult) (pediatric): Secondary | ICD-10-CM | POA: Diagnosis not present

## 2022-02-25 DIAGNOSIS — G4733 Obstructive sleep apnea (adult) (pediatric): Secondary | ICD-10-CM | POA: Diagnosis not present

## 2022-02-28 DIAGNOSIS — E785 Hyperlipidemia, unspecified: Secondary | ICD-10-CM | POA: Diagnosis not present

## 2022-02-28 DIAGNOSIS — R7303 Prediabetes: Secondary | ICD-10-CM | POA: Diagnosis not present

## 2022-03-06 DIAGNOSIS — D75839 Thrombocytosis, unspecified: Secondary | ICD-10-CM | POA: Diagnosis not present

## 2022-03-06 DIAGNOSIS — I25119 Atherosclerotic heart disease of native coronary artery with unspecified angina pectoris: Secondary | ICD-10-CM | POA: Diagnosis not present

## 2022-03-06 DIAGNOSIS — K2270 Barrett's esophagus without dysplasia: Secondary | ICD-10-CM | POA: Diagnosis not present

## 2022-03-06 DIAGNOSIS — G4730 Sleep apnea, unspecified: Secondary | ICD-10-CM | POA: Diagnosis not present

## 2022-03-06 DIAGNOSIS — R197 Diarrhea, unspecified: Secondary | ICD-10-CM | POA: Diagnosis not present

## 2022-03-06 DIAGNOSIS — R7303 Prediabetes: Secondary | ICD-10-CM | POA: Diagnosis not present

## 2022-03-06 DIAGNOSIS — E785 Hyperlipidemia, unspecified: Secondary | ICD-10-CM | POA: Diagnosis not present

## 2022-03-06 DIAGNOSIS — Z0000 Encounter for general adult medical examination without abnormal findings: Secondary | ICD-10-CM | POA: Diagnosis not present

## 2022-03-26 DIAGNOSIS — G4733 Obstructive sleep apnea (adult) (pediatric): Secondary | ICD-10-CM | POA: Diagnosis not present

## 2022-05-16 DIAGNOSIS — C4441 Basal cell carcinoma of skin of scalp and neck: Secondary | ICD-10-CM | POA: Diagnosis not present

## 2022-05-16 DIAGNOSIS — D225 Melanocytic nevi of trunk: Secondary | ICD-10-CM | POA: Diagnosis not present

## 2022-05-16 DIAGNOSIS — Z1283 Encounter for screening for malignant neoplasm of skin: Secondary | ICD-10-CM | POA: Diagnosis not present

## 2022-06-06 ENCOUNTER — Other Ambulatory Visit: Payer: Self-pay | Admitting: Cardiology

## 2022-06-13 DIAGNOSIS — R7303 Prediabetes: Secondary | ICD-10-CM | POA: Diagnosis not present

## 2022-06-13 DIAGNOSIS — E785 Hyperlipidemia, unspecified: Secondary | ICD-10-CM | POA: Diagnosis not present

## 2022-06-19 DIAGNOSIS — I25119 Atherosclerotic heart disease of native coronary artery with unspecified angina pectoris: Secondary | ICD-10-CM | POA: Diagnosis not present

## 2022-06-19 DIAGNOSIS — Z7182 Exercise counseling: Secondary | ICD-10-CM | POA: Diagnosis not present

## 2022-06-19 DIAGNOSIS — Z79899 Other long term (current) drug therapy: Secondary | ICD-10-CM | POA: Diagnosis not present

## 2022-06-19 DIAGNOSIS — E1169 Type 2 diabetes mellitus with other specified complication: Secondary | ICD-10-CM | POA: Diagnosis not present

## 2022-06-19 DIAGNOSIS — C44319 Basal cell carcinoma of skin of other parts of face: Secondary | ICD-10-CM | POA: Diagnosis not present

## 2022-06-19 DIAGNOSIS — B078 Other viral warts: Secondary | ICD-10-CM | POA: Diagnosis not present

## 2022-06-19 DIAGNOSIS — R197 Diarrhea, unspecified: Secondary | ICD-10-CM | POA: Diagnosis not present

## 2022-06-19 DIAGNOSIS — I10 Essential (primary) hypertension: Secondary | ICD-10-CM | POA: Diagnosis not present

## 2022-06-19 DIAGNOSIS — Z08 Encounter for follow-up examination after completed treatment for malignant neoplasm: Secondary | ICD-10-CM | POA: Diagnosis not present

## 2022-06-19 DIAGNOSIS — D75839 Thrombocytosis, unspecified: Secondary | ICD-10-CM | POA: Diagnosis not present

## 2022-06-19 DIAGNOSIS — K2270 Barrett's esophagus without dysplasia: Secondary | ICD-10-CM | POA: Diagnosis not present

## 2022-06-19 DIAGNOSIS — Z85828 Personal history of other malignant neoplasm of skin: Secondary | ICD-10-CM | POA: Diagnosis not present

## 2022-06-19 DIAGNOSIS — Z713 Dietary counseling and surveillance: Secondary | ICD-10-CM | POA: Diagnosis not present

## 2022-06-19 DIAGNOSIS — E785 Hyperlipidemia, unspecified: Secondary | ICD-10-CM | POA: Diagnosis not present

## 2022-06-19 DIAGNOSIS — G4730 Sleep apnea, unspecified: Secondary | ICD-10-CM | POA: Diagnosis not present

## 2022-06-21 ENCOUNTER — Encounter (INDEPENDENT_AMBULATORY_CARE_PROVIDER_SITE_OTHER): Payer: Self-pay | Admitting: *Deleted

## 2022-06-22 ENCOUNTER — Encounter (INDEPENDENT_AMBULATORY_CARE_PROVIDER_SITE_OTHER): Payer: Self-pay | Admitting: *Deleted

## 2022-07-18 DIAGNOSIS — Z9989 Dependence on other enabling machines and devices: Secondary | ICD-10-CM | POA: Diagnosis not present

## 2022-07-18 DIAGNOSIS — Z79899 Other long term (current) drug therapy: Secondary | ICD-10-CM | POA: Diagnosis not present

## 2022-07-18 DIAGNOSIS — K2270 Barrett's esophagus without dysplasia: Secondary | ICD-10-CM | POA: Diagnosis not present

## 2022-07-18 DIAGNOSIS — G4730 Sleep apnea, unspecified: Secondary | ICD-10-CM | POA: Diagnosis not present

## 2022-07-18 DIAGNOSIS — Z713 Dietary counseling and surveillance: Secondary | ICD-10-CM | POA: Diagnosis not present

## 2022-07-18 DIAGNOSIS — I10 Essential (primary) hypertension: Secondary | ICD-10-CM | POA: Diagnosis not present

## 2022-07-31 DIAGNOSIS — Z08 Encounter for follow-up examination after completed treatment for malignant neoplasm: Secondary | ICD-10-CM | POA: Diagnosis not present

## 2022-07-31 DIAGNOSIS — D225 Melanocytic nevi of trunk: Secondary | ICD-10-CM | POA: Diagnosis not present

## 2022-07-31 DIAGNOSIS — C44319 Basal cell carcinoma of skin of other parts of face: Secondary | ICD-10-CM | POA: Diagnosis not present

## 2022-07-31 DIAGNOSIS — Z85828 Personal history of other malignant neoplasm of skin: Secondary | ICD-10-CM | POA: Diagnosis not present

## 2022-08-02 ENCOUNTER — Telehealth (INDEPENDENT_AMBULATORY_CARE_PROVIDER_SITE_OTHER): Payer: Self-pay | Admitting: Gastroenterology

## 2022-08-02 ENCOUNTER — Ambulatory Visit (INDEPENDENT_AMBULATORY_CARE_PROVIDER_SITE_OTHER): Payer: Medicare Other | Admitting: Gastroenterology

## 2022-08-02 ENCOUNTER — Encounter (INDEPENDENT_AMBULATORY_CARE_PROVIDER_SITE_OTHER): Payer: Self-pay | Admitting: Gastroenterology

## 2022-08-02 VITALS — BP 125/75 | HR 80 | Temp 97.1°F | Ht 73.0 in | Wt 211.7 lb

## 2022-08-02 DIAGNOSIS — K2270 Barrett's esophagus without dysplasia: Secondary | ICD-10-CM | POA: Diagnosis not present

## 2022-08-02 DIAGNOSIS — K219 Gastro-esophageal reflux disease without esophagitis: Secondary | ICD-10-CM

## 2022-08-02 MED ORDER — PANTOPRAZOLE SODIUM 40 MG PO TBEC: 40.0000 mg | DELAYED_RELEASE_TABLET | Freq: Every day | ORAL | 3 refills | Status: DC

## 2022-08-02 NOTE — Patient Instructions (Signed)
Schedule EGD Restart pantoprazole 40 mg qday

## 2022-08-02 NOTE — Telephone Encounter (Signed)
    08/02/22  Samuel Tanner [birth year]  What type of surgery is being performed? EGD  When is surgery scheduled? TBD  Clearance to hold Plavix   Name of physician performing surgery?  Dr. [name] [facility] Phone: [phone] Fax: [phone]  Anethesia type (none, local, MAC, general)? MAC

## 2022-08-02 NOTE — Telephone Encounter (Signed)
   Name: MACLIN GUERRETTE  DOB: [birth year]  MRN: [id number]  Primary Cardiologist: Dina Rich, MD  Chart reviewed as part of pre-operative protocol coverage. Because of Patryk Conant Natal's past medical history and time since last visit, he will require a follow-up in-office visit in order to better assess preoperative cardiovascular risk.  Pre-op covering staff: - Please schedule appointment and call patient to inform them. If patient already had an upcoming appointment within acceptable timeframe, please add "pre-op clearance" to the appointment notes so provider is aware. - Please contact requesting surgeon's office via preferred method (i.e, phone, fax) to inform them of need for appointment prior to surgery.   Joylene Grapes, NP  08/02/2022, 11:27 AM

## 2022-08-02 NOTE — H&P (View-Only) (Signed)
Samuel Tanner, M.D. Gastroenterology & Hepatology [facility] Gastroenterology [address] [place], [place] [address] Primary Care Physician: Samuel Stabile, MD [address] [place] [address]  Referring MD: PCP  Chief Complaint: Barrett's esophagus  History of Present Illness: Samuel Tanner is a 72 y.o. 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  Patient reports he was taking pantoprazole 40 mg qday in the past but he did not follow in our clinic since 2020 so the medication was not refilled.  He is to see Samuel Tanner In the clinic for surveillance of his GERD and Barrett's esophagus.  He may have some heartburn once a week, for which he takes Gaviscon or Tums, not taking other medicines. No dysphagia or odynophagia.  The patient denies having any nausea, vomiting, fever, chills, hematochezia, melena, hematemesis, abdominal distention, abdominal pain, diarrhea, jaundice, pruritus or weight loss.  Notably, most recent abdominal imaging was performed on 717 2111.  He had a CT of the abdomen and pelvis with IV contrast that showed diverticulum in the transverse colon but no presence of duodenal lesions.  Based on previous notes from Samuel Tanner "submucosal duodenal lesion which previously has been evaluated at [facility] and felt to be Samuel Tanner gland hypertrophy(EUS with FNA in August 2011 and redo in May 2012 at [facility])".  I reviewed the evaluation performed by Samuel Tanner in May 2012.  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  Last EGD: 03/29/2013 Performed for evaluation  of melena 2.5 cm tongue of salmon-colored epithelium at the GE junction with biopsies consistent with Barrett's esophagus without dysplasi, Schatzki's ring, presence of a Tanner at the cardia and fundus with presence of a basal, this was injected with epinephrine and 3 clips were placed.  There was presence of a multilobulated submucosal duodenal mass which measured between 3 to 4 cm in size.  Last Colonoscopy: 11/05/2018 Colocolonic anastomosis at 25 cm from the anus, diverticulosis Repeat colonoscopy in 5 years  FHx: neg for any gastrointestinal/liver disease, father colon cancer in his 65s Social: neg smoking, alcohol or illicit drug use Surgical: colon resection, cholecystectomy  Past Medical History: Past Medical History:  Diagnosis Date   Appendiceal carcinoid tumor    resection  15 yrs ago   Barrett esophagus 03/29/13   Preformed by Samuel Tanner   Chest tightness, discomfort, or pressure    Coronary artery disease    Hyperlipidemia    mixed   MI (myocardial infarction) (HCC)    Tanner     Past Surgical History: Past Surgical History:  Procedure Laterality Date    appendiceal carcinoid   1996   APPENDECTOMY  1996   CARDIAC CATHETERIZATION     06/27/2007 and 09/17/2001   CHOLECYSTECTOMY  1998   COLONOSCOPY     COLONOSCOPY N/A 07/30/2013   Procedure: COLONOSCOPY;  Surgeon: Samuel Hippo, MD;  [facility];  Service: Endoscopy;  Laterality: N/A;  1030   COLONOSCOPY N/A 11/05/2018   Procedure: COLONOSCOPY;  Surgeon: Samuel Hippo, MD;  [facility];  Service: Endoscopy;  Laterality: N/A;  1200   ESOPHAGOGASTRODUODENOSCOPY N/A 03/29/2013   Procedure: ESOPHAGOGASTRODUODENOSCOPY (EGD);  Surgeon: Samuel Ade, MD;  [facility];  Service: Endoscopy;  Laterality: N/A;   HERNIA REPAIR     NASAL SEPTOPLASTY W/ TURBINOPLASTY Bilateral 01/12/2014   Procedure: BILATERAL TURBINATE RESECTION/SEPTOPLASTY ;  Surgeon: Samuel Moll, MD;  Location: [facility];  Service: ENT;  Laterality: Bilateral;   SPLENECTOMY  1996   along with colon resection   TONSILLECTOMY     UPPER GASTROINTESTINAL ENDOSCOPY     03/29/13    Family History: Family History  Problem Relation Age of Onset   Heart attack Father    Cancer Father        prostate cancer   Heart disease Father    Colon cancer Father     Social History: Social History   Tobacco Use  Smoking Status Never  Smokeless Tobacco Never   Social History   Substance and Sexual Activity  Alcohol Use No   Alcohol/week: 0.0 standard drinks of alcohol   Social History   Substance and Sexual Activity  Drug Use No    Allergies: Allergies  Allergen Reactions   Samuel [Aspirin]     Samuel Tanner   Samuel Tanner     hallucinations    Medications: Current Outpatient Medications  Medication Sig Dispense Refill   acetaminophen (TYLENOL) 500 MG tablet Take 500 mg by mouth every 6 (six) hours as needed for moderate pain or headache.     cholestyramine (QUESTRAN) 4 GM/DOSE powder Take 4 g by mouth daily.      clopidogrel (PLAVIX) 75 MG tablet Take 1 tablet by mouth once daily 90 tablet 0   diphenhydramine-acetaminophen (TYLENOL PM) 25-500 MG TABS tablet Take 1 tablet by mouth at bedtime as needed (sleep).     loratadine (CLARITIN) 10 MG tablet Take 10 mg by mouth daily.     losartan (COZAAR) 50 MG tablet Take 50 mg by mouth daily.     nitroGLYCERIN (NITROSTAT) 0.4 MG SL tablet Place 1 tablet (0.4 mg total) under the tongue every 5 (five) minutes as needed for chest pain. 25 tablet 4   Probiotic Product (PROBIOTIC DAILY PO) Take 1 capsule by mouth daily.      rosuvastatin (CRESTOR) 40 MG tablet TAKE 1/2 (ONE-HALF) TABLET BY MOUTH ONCE DAILY AT BEDTIME 45 tablet 3   pantoprazole (PROTONIX) 40 MG tablet Take 1 tablet (40 mg total) by mouth daily before breakfast. (Patient not taking: Reported on 08/02/2022) 90 tablet 3   No current facility-administered medications for  this visit.    Review of Systems: GENERAL: negative for malaise, night sweats HEENT: No changes in hearing or vision, no nose bleeds or other nasal problems. NECK: Negative for lumps, goiter, pain and significant neck swelling RESPIRATORY: Negative for cough, wheezing CARDIOVASCULAR: Negative for chest pain, leg swelling, palpitations, orthopnea GI: SEE HPI MUSCULOSKELETAL: Negative for joint pain or swelling, back pain, and muscle pain. SKIN: Negative for lesions, rash PSYCH: Negative for sleep disturbance, mood disorder and recent psychosocial stressors. HEMATOLOGY Negative for prolonged bleeding, bruising easily, and swollen nodes. ENDOCRINE: Negative for cold or heat intolerance, polyuria, polydipsia and goiter. NEURO: negative for tremor, gait imbalance, syncope and seizures. The remainder of the review of systems is noncontributory.   Physical Exam: BP 125/75 (BP Location: Left Arm, Patient Position: Sitting, Cuff Size: Large)   Pulse 80   Temp (!) 97.1 F (36.2 C) (Temporal)   Ht 6\' 1"  (1.854 m)   Wt 211 lb 11.2 oz (96 kg)  BMI 27.93 kg/m  GENERAL: The patient is AO x3, in no acute distress. HEENT: Head is normocephalic and atraumatic. EOMI are intact. Mouth is well hydrated and without lesions. NECK: Supple. No masses LUNGS: Clear to auscultation. No presence of rhonchi/wheezing/rales. Adequate chest expansion HEART: RRR, normal s1 and s2. ABDOMEN: Soft, nontender, no guarding, no peritoneal signs, and nondistended. BS +. No masses. EXTREMITIES: Without any cyanosis, clubbing, rash, lesions or edema. NEUROLOGIC: AOx3, no focal motor deficit. SKIN: no jaundice, no rashes   Imaging/Labs: as above  I personally reviewed and interpreted the available labs, imaging and endoscopic files.  Impression and Plan: Samuel Tanner is a 72 y.o. male with PMH CAD s/p stent placement, GERD c/b Barrett'ss esophagus, OSA, hypertension, appendiceal peritoneal myxoma status post  resection and chemotherapy, hyperlipidemia, history of myocardial infarction, who presents for evaluation of Barrett's esophagus.  Patient has presented recurrent episodes of heartburn intermittently for the last few years after stopping pantoprazole.  Unfortunately he did not follow up in our clinic and this medication was not refilled.  He has not presented any red flag signs but he is due for Barrett's esophagus surveillance.  We had a thorough discussion regarding the importance of restarting his PPI and having regular follow-up with our clinic.  He understood this and will proceed with EGD.  I will refill his pantoprazole 40 mg every day.  Notably, he had a duodenal lesion which has been thoroughly evaluated in the past and was found to be consistent with a Brunner's gland hyperplasia.  It will be reevaluated during the next esophagogastroduodenospy but it is unlikely this will warrant further workup.  -Schedule EGD -Restart pantoprazole 40 mg qday - Will need to obtain clearance from cardiologist to stop Plavix 5 days before the procedure  All questions were answered.      Samuel Blazing, MD Gastroenterology and Hepatology [facility] Gastroenterology

## 2022-08-02 NOTE — Telephone Encounter (Signed)
Lmtrc to set up in office appointment

## 2022-08-02 NOTE — Progress Notes (Signed)
Samuel Tanner, M.D. Gastroenterology & Hepatology [facility] Gastroenterology [address] [place], [place] [address] Primary Care Physician: Benita Stabile, MD [address] [place] [address]  Referring MD: PCP  Chief Complaint: Barrett's esophagus  History of Present Illness: Samuel Tanner is a 72 y.o. 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  Patient reports he was taking pantoprazole 40 mg qday in the past but he did not follow in our clinic since 2020 so the medication was not refilled.  He is to see Dr. Karilyn Cota In the clinic for surveillance of his GERD and Barrett's esophagus.  He may have some heartburn once a week, for which he takes Gaviscon or Tums, not taking other medicines. No dysphagia or odynophagia.  The patient denies having any nausea, vomiting, fever, chills, hematochezia, melena, hematemesis, abdominal distention, abdominal pain, diarrhea, jaundice, pruritus or weight loss.  Notably, most recent abdominal imaging was performed on 717 2111.  He had a CT of the abdomen and pelvis with IV contrast that showed diverticulum in the transverse colon but no presence of duodenal lesions.  Based on previous notes from Dr. Karilyn Cota "submucosal duodenal lesion which previously has been evaluated at [facility] and felt to be Lorrene Reid gland hypertrophy(EUS with FNA in August 2011 and redo in May 2012 at [facility])".  I reviewed the evaluation performed by Dr. Lanell Matar in May 2012.  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  Last EGD: 03/29/2013 Performed for evaluation  of melena 2.5 cm tongue of salmon-colored epithelium at the GE junction with biopsies consistent with Barrett's esophagus without dysplasi, Schatzki's ring, presence of a ulcer at the cardia and fundus with presence of a basal, this was injected with epinephrine and 3 clips were placed.  There was presence of a multilobulated submucosal duodenal mass which measured between 3 to 4 cm in size.  Last Colonoscopy: 11/05/2018 Colocolonic anastomosis at 25 cm from the anus, diverticulosis Repeat colonoscopy in 5 years  FHx: neg for any gastrointestinal/liver disease, father colon cancer in his 84s Social: neg smoking, alcohol or illicit drug use Surgical: colon resection, cholecystectomy  Past Medical History: Past Medical History:  Diagnosis Date   Appendiceal carcinoid tumor    resection  15 yrs ago   Barrett esophagus 03/29/13   Preformed by Dr.Rourk   Chest tightness, discomfort, or pressure    Coronary artery disease    Hyperlipidemia    mixed   MI (myocardial infarction) (HCC)    Ulcer     Past Surgical History: Past Surgical History:  Procedure Laterality Date    appendiceal carcinoid   1996   APPENDECTOMY  1996   CARDIAC CATHETERIZATION     06/27/2007 and 09/17/2001   CHOLECYSTECTOMY  1998   COLONOSCOPY     COLONOSCOPY N/A 07/30/2013   Procedure: COLONOSCOPY;  Surgeon: Malissa Hippo, MD;  [facility];  Service: Endoscopy;  Laterality: N/A;  1030   COLONOSCOPY N/A 11/05/2018   Procedure: COLONOSCOPY;  Surgeon: Malissa Hippo, MD;  [facility];  Service: Endoscopy;  Laterality: N/A;  1200   ESOPHAGOGASTRODUODENOSCOPY N/A 03/29/2013   Procedure: ESOPHAGOGASTRODUODENOSCOPY (EGD);  Surgeon: Corbin Ade, MD;  [facility];  Service: Endoscopy;  Laterality: N/A;   HERNIA REPAIR     NASAL SEPTOPLASTY W/ TURBINOPLASTY Bilateral 01/12/2014   Procedure: BILATERAL TURBINATE RESECTION/SEPTOPLASTY ;  Surgeon: Darletta Moll, MD;  Location: [facility];  Service: ENT;  Laterality: Bilateral;   SPLENECTOMY  1996   along with colon resection   TONSILLECTOMY     UPPER GASTROINTESTINAL ENDOSCOPY     03/29/13    Family History: Family History  Problem Relation Age of Onset   Heart attack Father    Cancer Father        prostate cancer   Heart disease Father    Colon cancer Father     Social History: Social History   Tobacco Use  Smoking Status Never  Smokeless Tobacco Never   Social History   Substance and Sexual Activity  Alcohol Use No   Alcohol/week: 0.0 standard drinks of alcohol   Social History   Substance and Sexual Activity  Drug Use No    Allergies: Allergies  Allergen Reactions   Asa [Aspirin]     PT HAS PEPTIC ULCER   Morphine And Codeine     hallucinations    Medications: Current Outpatient Medications  Medication Sig Dispense Refill   acetaminophen (TYLENOL) 500 MG tablet Take 500 mg by mouth every 6 (six) hours as needed for moderate pain or headache.     cholestyramine (QUESTRAN) 4 GM/DOSE powder Take 4 g by mouth daily.      clopidogrel (PLAVIX) 75 MG tablet Take 1 tablet by mouth once daily 90 tablet 0   diphenhydramine-acetaminophen (TYLENOL PM) 25-500 MG TABS tablet Take 1 tablet by mouth at bedtime as needed (sleep).     loratadine (CLARITIN) 10 MG tablet Take 10 mg by mouth daily.     losartan (COZAAR) 50 MG tablet Take 50 mg by mouth daily.     nitroGLYCERIN (NITROSTAT) 0.4 MG SL tablet Place 1 tablet (0.4 mg total) under the tongue every 5 (five) minutes as needed for chest pain. 25 tablet 4   Probiotic Product (PROBIOTIC DAILY PO) Take 1 capsule by mouth daily.      rosuvastatin (CRESTOR) 40 MG tablet TAKE 1/2 (ONE-HALF) TABLET BY MOUTH ONCE DAILY AT BEDTIME 45 tablet 3   pantoprazole (PROTONIX) 40 MG tablet Take 1 tablet (40 mg total) by mouth daily before breakfast. (Patient not taking: Reported on 08/02/2022) 90 tablet 3   No current facility-administered medications for  this visit.    Review of Systems: GENERAL: negative for malaise, night sweats HEENT: No changes in hearing or vision, no nose bleeds or other nasal problems. NECK: Negative for lumps, goiter, pain and significant neck swelling RESPIRATORY: Negative for cough, wheezing CARDIOVASCULAR: Negative for chest pain, leg swelling, palpitations, orthopnea GI: SEE HPI MUSCULOSKELETAL: Negative for joint pain or swelling, back pain, and muscle pain. SKIN: Negative for lesions, rash PSYCH: Negative for sleep disturbance, mood disorder and recent psychosocial stressors. HEMATOLOGY Negative for prolonged bleeding, bruising easily, and swollen nodes. ENDOCRINE: Negative for cold or heat intolerance, polyuria, polydipsia and goiter. NEURO: negative for tremor, gait imbalance, syncope and seizures. The remainder of the review of systems is noncontributory.   Physical Exam: BP 125/75 (BP Location: Left Arm, Patient Position: Sitting, Cuff Size: Large)   Pulse 80   Temp (!) 97.1 F (36.2 C) (Temporal)   Ht 6\' 1"  (1.854 m)   Wt 211 lb 11.2 oz (96 kg)  BMI 27.93 kg/m  GENERAL: The patient is AO x3, in no acute distress. HEENT: Head is normocephalic and atraumatic. EOMI are intact. Mouth is well hydrated and without lesions. NECK: Supple. No masses LUNGS: Clear to auscultation. No presence of rhonchi/wheezing/rales. Adequate chest expansion HEART: RRR, normal s1 and s2. ABDOMEN: Soft, nontender, no guarding, no peritoneal signs, and nondistended. BS +. No masses. EXTREMITIES: Without any cyanosis, clubbing, rash, lesions or edema. NEUROLOGIC: AOx3, no focal motor deficit. SKIN: no jaundice, no rashes   Imaging/Labs: as above  I personally reviewed and interpreted the available labs, imaging and endoscopic files.  Impression and Plan: Samuel Tanner is a 72 y.o. male with PMH CAD s/p stent placement, GERD c/b Barrett'ss esophagus, OSA, hypertension, appendiceal peritoneal myxoma status post  resection and chemotherapy, hyperlipidemia, history of myocardial infarction, who presents for evaluation of Barrett's esophagus.  Patient has presented recurrent episodes of heartburn intermittently for the last few years after stopping pantoprazole.  Unfortunately he did not follow up in our clinic and this medication was not refilled.  He has not presented any red flag signs but he is due for Barrett's esophagus surveillance.  We had a thorough discussion regarding the importance of restarting his PPI and having regular follow-up with our clinic.  He understood this and will proceed with EGD.  I will refill his pantoprazole 40 mg every day.  Notably, he had a duodenal lesion which has been thoroughly evaluated in the past and was found to be consistent with a Brunner's gland hyperplasia.  It will be reevaluated during the next esophagogastroduodenospy but it is unlikely this will warrant further workup.  -Schedule EGD -Restart pantoprazole 40 mg qday - Will need to obtain clearance from cardiologist to stop Plavix 5 days before the procedure  All questions were answered.      Samuel Blazing, MD Gastroenterology and Hepatology [facility] Gastroenterology

## 2022-08-03 NOTE — Telephone Encounter (Signed)
S/w the pt and he has been scheduled to see Randall An, [facility] for pre op clearance. Pt will keep his yrly f/u with Dr. Wyline Mood 10/2022.

## 2022-08-03 NOTE — Telephone Encounter (Signed)
Thanks, please keep [place] updated

## 2022-08-09 ENCOUNTER — Encounter: Payer: Self-pay | Admitting: Student

## 2022-08-09 ENCOUNTER — Ambulatory Visit: Payer: Medicare Other | Attending: Student | Admitting: Student

## 2022-08-09 VITALS — BP 130/70 | HR 67 | Ht 73.0 in | Wt 212.2 lb

## 2022-08-09 DIAGNOSIS — I10 Essential (primary) hypertension: Secondary | ICD-10-CM | POA: Diagnosis not present

## 2022-08-09 DIAGNOSIS — Z01810 Encounter for preprocedural cardiovascular examination: Secondary | ICD-10-CM

## 2022-08-09 DIAGNOSIS — I493 Ventricular premature depolarization: Secondary | ICD-10-CM

## 2022-08-09 DIAGNOSIS — E785 Hyperlipidemia, unspecified: Secondary | ICD-10-CM

## 2022-08-09 DIAGNOSIS — I2510 Atherosclerotic heart disease of native coronary artery without angina pectoris: Secondary | ICD-10-CM

## 2022-08-09 MED ORDER — ROSUVASTATIN CALCIUM 20 MG PO TABS: 20.0000 mg | ORAL_TABLET | Freq: Every day | ORAL | 3 refills | Status: DC

## 2022-08-09 NOTE — Progress Notes (Signed)
Cardiology Office Note    Date:  08/09/2022  ID:  Samuel Tanner, DOB 06-Dec-1950, MRN [id number] Cardiologist: Dina Rich, MD    History of Present Illness:    Samuel Tanner is a 72 y.o. male with past medical history of CAD (s/p prior stent to LCx, patent stent by cath in 2009, low-risk NST in 06/2013), HTN, HLD, Barrett's esophagus and GERD who presents to the office today for preoperative cardiac clearance.   He was last examined by Dr. Wyline Mood in 07/2021 and denied any recent anginal symptoms at that time. He was continued on his current medical therapy with Plavix 75 mg daily, Crestor 20 mg daily and PRN SL NTG. He had been on Plavix for secondary prevention due to GI issues while on ASA.  He was evaluated by GI in 07/2022 with EGD recommended given known Barrett's esophagus and a follow-up visit was recommended for preoperative cardiac clearance given the timeframe since his last evaluation. It was requested by GI to hold Plavix for 5 days prior to his procedure.  In talking with the patient today, he reports overall feeling well since his last office visit. He remains active at baseline in doing yard work around his home and also doing routine chores around the house and denies any recent chest pain or dyspnea on exertion with these activities. No recent palpitations, orthopnea, PND or pitting edema. No dizziness or presyncope. Reports his PCP did start him on Losartan given elevated BP and he has overall been tolerating this well without side effects.  Studies Reviewed:   EKG: EKG is ordered today and demonstrates:    EKG Interpretation Date/Time:  Thursday August 09 2022 14:33:25 EDT Ventricular Rate:  67 PR Interval:  230 QRS Duration:  84 QT Interval:  410 QTC Calculation: 433 R Axis:   54  Text Interpretation: Sinus rhythm with 1st degree A-V block with occasional Premature ventricular complexes PVC's noted on prior tracings. Slight TWI along lateral leads noted on prior  tracings as well. Confirmed by Randall An ([id number]) on 08/09/2022 4:16:16 PM       Echocardiogram: 06/2013 Study Conclusions   - Left ventricle: The cavity size was normal. Wall thickness was    normal. Systolic function was normal. The estimated ejection    fraction was in the range of 60% to 65%. Doppler parameters are    consistent with abnormal left ventricular relaxation (grade 1    diastolic dysfunction).  - Aortic valve: Mildly calcified annulus. Normal thickness    leaflets. Valve area (VTI): 2.39 cm^2. Valve area (Vmax): 2.63    cm^2.  - Mitral valve: Mildly calcified annulus. Normal thickness leaflets    .  - Left atrium: The atrium was mildly dilated.  - Technically difficult study.   NST: 06/2013 IMPRESSION:  1. Negative exercise MPI for ischemia   2.  Negative stress EKG for ischemia   3.  Normal left ventricular systolic function, LVEF 70%   4. Duke treadmill score of 10, consistent with low risk for major  cardiac events   5. Exceptional exercise functional capacity (>150% of predicted  based on age and gender)    Physical Exam:   VS:  BP 130/70   Pulse 67   Ht 6\' 1"  (1.854 m)   Wt 212 lb 3.2 oz (96.3 kg)   SpO2 92%   BMI 28.00 kg/m    Wt Readings from Last 3 Encounters:  08/09/22 212 lb 3.2 oz (96.3 kg)  08/02/22 211  lb 11.2 oz (96 kg)  07/31/21 212 lb (96.2 kg)     GEN: Well nourished, well developed male appearing in no acute distress NECK: No JVD; No carotid bruits CARDIAC: RRR, no murmurs, rubs, gallops RESPIRATORY:  Clear to auscultation without rales, wheezing or rhonchi  ABDOMEN: Appears non-distended. No obvious abdominal masses. EXTREMITIES: No clubbing or cyanosis. No pitting edema.  Distal pedal pulses are 2+ bilaterally.   Assessment and Plan:   1. Preoperative Cardiac Clearance for EGD - He is able to perform more than 4 METS of activity without anginal symptoms.  EKG today does show PVC's and slight TWI along the lateral  leads but this is overall similar to prior tracings. RCRI Risk is overall low at 0.9% risk of a major cardiac event and he does not require further cardiac workup prior to undergoing EGD. Given no recent intervention, he can hold Plavix for 5 days prior to his procedure. Will route today's office note to Dr. Levon Hedger.   2. CAD - He underwent prior stenting to the LCx and did have a patent stent by cath in 2009. Most recent ischemic evaluation was a low-risk NST in 06/2013. He denies any recent anginal symptoms as discussed above. Reviewed with the patient we would likely obtain a nuclear stress test next year given that he will be 10 years out since his last ischemic evaluation. He prefers to schedule this at the time of his next visit. - Continue Plavix 75 mg daily (has been on this over ASA given prior intolerances), Losartan 50 mg daily and Crestor 20 mg daily.  3. HTN - BP is well-controlled at 130/70 during today's visit. Continue current medical therapy with Losartan 50 mg daily.  4. HLD - Followed by his PCP.  FLP in 05/2022 showed total cholesterol 128, triglycerides 85, HDL 42 and LDL 69. LFT's WNL. Continue current medical therapy with Crestor 20 mg daily.    5. PVC's - He did have PVC's on his EKG today and these have been noted on prior tracings as well. He has overall been asymptomatic with them and says he has been told throughout his life that he has "extra beats". Labs in 05/2022 did not show any acute electrolyte abnormalities.  - If these increase in frequency or he develops any associated symptoms, would recommend a Zio patch to quantify his PVC burden. In listening on examination, he only had one ectopic beat. He has not been on AV nodal blocking agents given his heart rate in the 50's at times.  Signed, Ellsworth Lennox, PA-C

## 2022-08-09 NOTE — Patient Instructions (Signed)
Medication Instructions:  Your physician recommends that you continue on your current medications as directed. Please refer to the Current Medication list given to you today.  Hold Plavix 5 days prior to Endoscopy    *If you need a refill on your cardiac medications before your next appointment, please call your pharmacy*   Lab Work: NONE   If you have labs (blood work) drawn today and your tests are completely normal, you will receive your results only by: MyChart Message (if you have MyChart) OR A paper copy in the mail If you have any lab test that is abnormal or we need to change your treatment, we will call you to review the results.   Testing/Procedures: NONE    Follow-Up: At [facility], you and your health needs are our priority.  As part of our continuing mission to provide you with exceptional heart care, we have created designated Provider Care Teams.  These Care Teams include your primary Cardiologist (physician) and Advanced Practice Providers (APPs -  Physician Assistants and Nurse Practitioners) who all work together to provide you with the care you need, when you need it.  We recommend signing up for the patient portal called "MyChart".  Sign up information is provided on this After Visit Summary.  MyChart is used to connect with patients for Virtual Visits (Telemedicine).  Patients are able to view lab/test results, encounter notes, upcoming appointments, etc.  Non-urgent messages can be sent to your provider as well.   To learn more about what you can do with MyChart, go to [email or web address].    Your next appointment:   1 year(s)  Provider:   You may see Dina Rich, MD or one of the following Advanced Practice Providers on your designated Care Team:   Randall An, PA-C  Jacolyn Reedy, [place]     Other Instructions Thank you for choosing [facility]!

## 2022-08-10 ENCOUNTER — Telehealth (INDEPENDENT_AMBULATORY_CARE_PROVIDER_SITE_OTHER): Payer: Self-pay | Admitting: Gastroenterology

## 2022-08-10 ENCOUNTER — Encounter (INDEPENDENT_AMBULATORY_CARE_PROVIDER_SITE_OTHER): Payer: Self-pay

## 2022-08-10 NOTE — Progress Notes (Signed)
Pt contacted and EGD scheduled. Please see detailed telephone message.

## 2022-08-10 NOTE — Telephone Encounter (Signed)
Dolores Frame, MD  Carlyon Prows; [place], Kenney Houseman, LPN Thanks Gust Brooms, he is cleared, can hold Plavix for 5 days, please let him know Thanks  Pt contacted and EGD scheduled for 08/15/22 at 8am. Pt aware to hold Plavix for 5 days. Pt aware to not take Plavix today. Went over instructions with patient. Did not mail or send copy to my chart due to my chart pending and I was afraid his instructions would not get to him in time so I went over instructions while on the phone.   Notification or Prior Authorization is not required for the requested services You are not required to submit a notification/prior authorization based on the information provided. If you have general questions about the prior authorization requirements, visit [email or web address] > Clinician Resources > Advance and Admission Notification Requirements. The number above acknowledges your notification. Please write this reference number down for future reference. If you would like to request an organization determination, please call us at [phone]. Decision ID #: [id number]

## 2022-08-15 ENCOUNTER — Encounter (HOSPITAL_COMMUNITY)
Admission: RE | Disposition: A | Discharge status: Home / Self Care | Payer: Self-pay | Source: Home / Self Care | Attending: Gastroenterology

## 2022-08-15 ENCOUNTER — Ambulatory Visit (HOSPITAL_BASED_OUTPATIENT_CLINIC_OR_DEPARTMENT_OTHER): Payer: Medicare Other | Admitting: Anesthesiology

## 2022-08-15 ENCOUNTER — Ambulatory Visit (HOSPITAL_COMMUNITY): Payer: Medicare Other | Admitting: Anesthesiology

## 2022-08-15 ENCOUNTER — Encounter (HOSPITAL_COMMUNITY): Payer: Self-pay | Admitting: Gastroenterology

## 2022-08-15 ENCOUNTER — Ambulatory Visit (HOSPITAL_COMMUNITY)
Admission: RE | Admit: 2022-08-15 | Discharge: 2022-08-15 | Disposition: A | Discharge status: Home / Self Care | Payer: Medicare Other | Attending: Gastroenterology | Admitting: Gastroenterology

## 2022-08-15 ENCOUNTER — Other Ambulatory Visit: Payer: Self-pay

## 2022-08-15 DIAGNOSIS — I252 Old myocardial infarction: Secondary | ICD-10-CM | POA: Diagnosis not present

## 2022-08-15 DIAGNOSIS — Z8249 Family history of ischemic heart disease and other diseases of the circulatory system: Secondary | ICD-10-CM | POA: Insufficient documentation

## 2022-08-15 DIAGNOSIS — I2510 Atherosclerotic heart disease of native coronary artery without angina pectoris: Secondary | ICD-10-CM

## 2022-08-15 DIAGNOSIS — K219 Gastro-esophageal reflux disease without esophagitis: Secondary | ICD-10-CM | POA: Diagnosis not present

## 2022-08-15 DIAGNOSIS — G4733 Obstructive sleep apnea (adult) (pediatric): Secondary | ICD-10-CM | POA: Diagnosis not present

## 2022-08-15 DIAGNOSIS — I10 Essential (primary) hypertension: Secondary | ICD-10-CM | POA: Diagnosis not present

## 2022-08-15 DIAGNOSIS — Z955 Presence of coronary angioplasty implant and graft: Secondary | ICD-10-CM | POA: Diagnosis not present

## 2022-08-15 DIAGNOSIS — K3189 Other diseases of stomach and duodenum: Secondary | ICD-10-CM | POA: Diagnosis not present

## 2022-08-15 DIAGNOSIS — E785 Hyperlipidemia, unspecified: Secondary | ICD-10-CM | POA: Insufficient documentation

## 2022-08-15 DIAGNOSIS — K2270 Barrett's esophagus without dysplasia: Secondary | ICD-10-CM

## 2022-08-15 DIAGNOSIS — K449 Diaphragmatic hernia without obstruction or gangrene: Secondary | ICD-10-CM | POA: Diagnosis not present

## 2022-08-15 DIAGNOSIS — K31A0 Gastric intestinal metaplasia, unspecified: Secondary | ICD-10-CM | POA: Insufficient documentation

## 2022-08-15 HISTORY — PX: ESOPHAGOGASTRODUODENOSCOPY (EGD) WITH PROPOFOL: SHX5813

## 2022-08-15 HISTORY — DX: Sleep apnea, unspecified: G47.30

## 2022-08-15 HISTORY — PX: BIOPSY: SHX5522

## 2022-08-15 SURGERY — ESOPHAGOGASTRODUODENOSCOPY (EGD) WITH PROPOFOL
Anesthesia: General

## 2022-08-15 MED ORDER — LACTATED RINGERS IV SOLN: INTRAVENOUS | Status: DC

## 2022-08-15 MED ORDER — PROPOFOL 10 MG/ML IV BOLUS
INTRAVENOUS | Status: DC | PRN
  Administered 2022-08-15: 50 mg via INTRAVENOUS
  Administered 2022-08-15 (×2): 30 mg via INTRAVENOUS
  Administered 2022-08-15: 20 mg via INTRAVENOUS
  Administered 2022-08-15: 100 mg via INTRAVENOUS
  Administered 2022-08-15: 50 mg via INTRAVENOUS

## 2022-08-15 MED ORDER — LIDOCAINE HCL 1 % IJ SOLN
INTRAMUSCULAR | Status: DC | PRN
  Administered 2022-08-15: 50 mg via INTRADERMAL

## 2022-08-15 NOTE — Op Note (Signed)
[facility] Patient Name: Samuel Tanner Procedure Date: 08/15/2022 8:03 AM MRN: [id number] Date of Birth: 1950-04-26 Attending MD: [name] , , [id number] CSN: [id number] Age: 72 Admit Type: Outpatient Procedure:                Upper GI endoscopy Indications:              Follow-up of Barrett's esophagus Providers:                [name], Angelica Ran, Elinor Parkinson,                            Dyann Ruddle Referring MD:              Medicines:                Monitored Anesthesia Care Complications:            No immediate complications. Estimated Blood Loss:     Estimated blood loss: none. Procedure:                Pre-Anesthesia Assessment:                           - Prior to the procedure, a History and Physical                            was performed, and patient medications, allergies                            and sensitivities were reviewed. The patient's                            tolerance of previous anesthesia was reviewed.                           - The risks and benefits of the procedure and the                            sedation options and risks were discussed with the                            patient. All questions were answered and informed                            consent was obtained.                           - ASA Grade Assessment: II - A patient with mild                            systemic disease.                           After obtaining informed consent, the endoscope was                            passed under direct vision. Throughout the  procedure, the patient's blood pressure, pulse, and                            oxygen saturations were monitored continuously. The                            [id number]-H190 ([id number]) scope was introduced through the                            mouth, and advanced to the second part of duodenum.                            The upper GI endoscopy was accomplished without                             difficulty. The patient tolerated the procedure                            well. Scope In: 8:21:49 AM Scope Out: 8:32:41 AM Total Procedure Duration: 0 hours 10 minutes 52 seconds  Findings:      The esophagus and gastroesophageal junction were examined with white       light and narrow band imaging (NBI). There were esophageal mucosal       changes classified as Barrett's stage C0-M1 per Prague criteria. These       changes involved the mucosa at the upper extent of the gastric folds (40       cm from the incisors) extending to the Z-line (39 cm from the incisors).       Two tongues of salmon-colored mucosa were present from 39 to 40 cm. The       maximum longitudinal extent of these esophageal mucosal changes was 1 cm       in length. This was biopsied with a cold forceps for histology.      A 3 cm hiatal hernia was present.      The stomach was normal.      Three large submucosal nodules were found in the duodenal bulb. These       lesions appear to be similar to the lesions evaluated in the past and       considered to be Brunner gland hyperplasia. The nodules were Paris       classification Is (protruding, sessile). Biopsies were taken with a cold       forceps for histology. Impression:               - Esophageal mucosal changes classified as                            Barrett's stage C0-M1 per Prague criteria. Biopsied.                           - 3 cm hiatal hernia.                           - Normal stomach.                           -  Submucosal nodule found in the duodenum. Biopsied. Moderate Sedation:      Per Anesthesia Care Recommendation:           - Discharge patient to home (ambulatory).                           - Resume previous diet.                           - Await pathology results.                           - Continue present medications.                           - Restart Plavix today. Procedure Code(s):        --- Professional ---                            [phone], Esophagogastroduodenoscopy, flexible,                            transoral; with biopsy, single or multiple Diagnosis Code(s):        --- Professional ---                           K22.70, Barrett's esophagus without dysplasia                           K44.9, Diaphragmatic hernia without obstruction or                            gangrene                           K31.89, Other diseases of stomach and duodenum CPT copyright 2022 American Medical Association. All rights reserved. The codes documented in this report are preliminary and upon coder review may  be revised to meet current compliance requirements. [name], MD [name],  08/15/2022 8:45:16 AM This report has been signed electronically. Number of Addenda: 0

## 2022-08-15 NOTE — Anesthesia Postprocedure Evaluation (Signed)
Anesthesia Post Note  Patient: Samuel Tanner  Procedure(s) Performed: ESOPHAGOGASTRODUODENOSCOPY (EGD) WITH PROPOFOL BIOPSY  Patient location during evaluation: Short Stay Anesthesia Type: General Level of consciousness: awake and alert Pain management: pain level controlled Vital Signs Assessment: post-procedure vital signs reviewed and stable Respiratory status: spontaneous breathing Cardiovascular status: blood pressure returned to baseline and stable Postop Assessment: no apparent nausea or vomiting Anesthetic complications: no   No notable events documented.   Last Vitals:  Vitals:   08/15/22 0703  BP: 130/81  Pulse: 68  Resp: 16  Temp: 36.6 C  SpO2: 95%    Last Pain:  Vitals:   08/15/22 0811  TempSrc:   PainSc: 0-No pain                 Briasia Flinders

## 2022-08-15 NOTE — Transfer of Care (Signed)
Immediate Anesthesia Transfer of Care Note  Patient: Samuel Tanner  Procedure(s) Performed: ESOPHAGOGASTRODUODENOSCOPY (EGD) WITH PROPOFOL BIOPSY  Patient Location: Endoscopy Unit  Anesthesia Type:General  Level of Consciousness: awake  Airway & Oxygen Therapy: Patient Spontanous Breathing  Post-op Assessment: Report given to RN  Post vital signs: Reviewed and stable  Last Vitals:  Vitals Value Taken Time  BP    Temp    Pulse    Resp    SpO2      Last Pain:  Vitals:   08/15/22 0811  TempSrc:   PainSc: 0-No pain      Patients Stated Pain Goal: 4 (08/15/22 1308)  Complications: No notable events documented.

## 2022-08-15 NOTE — Interval H&P Note (Signed)
History and Physical Interval Note:  08/15/2022 7:37 AM  Samuel Tanner  has presented today for surgery, with the diagnosis of Barretts esophogus.  The various methods of treatment have been discussed with the patient and family. After consideration of risks, benefits and other options for treatment, the patient has consented to  Procedure(s) with comments: ESOPHAGOGASTRODUODENOSCOPY (EGD) WITH PROPOFOL (N/A) - 8:00am;asa 1 as a surgical intervention.  The patient's history has been reviewed, patient examined, no change in status, stable for surgery.  I have reviewed the patient's chart and labs.  Questions were answered to the patient's satisfaction.     [name] May[place]a

## 2022-08-15 NOTE — Anesthesia Preprocedure Evaluation (Signed)
Anesthesia Evaluation  Patient identified by MRN, date of birth, ID band Patient awake    Reviewed: Allergy & Precautions, H&P , NPO status , Patient's Chart, lab work & pertinent test results, reviewed documented beta blocker date and time   Airway Mallampati: II  TM Distance: >3 FB Neck ROM: full    Dental no notable dental hx.    Pulmonary neg pulmonary ROS, sleep apnea    Pulmonary exam normal breath sounds clear to auscultation       Cardiovascular Exercise Tolerance: Good + CAD, + Past MI and + Cardiac Stents   Rhythm:regular Rate:Normal     Neuro/Psych negative neurological ROS  negative psych ROS   GI/Hepatic negative GI ROS, Neg liver ROS,GERD  ,,  Endo/Other  negative endocrine ROS    Renal/GU negative Renal ROS  negative genitourinary   Musculoskeletal   Abdominal   Peds  Hematology negative hematology ROS (+) Blood dyscrasia, anemia   Anesthesia Other Findings   Reproductive/Obstetrics negative OB ROS                             Anesthesia Physical Anesthesia Plan  ASA: 3  Anesthesia Plan: General   Post-op Pain Management:    Induction:   PONV Risk Score and Plan: Propofol infusion  Airway Management Planned:   Additional Equipment:   Intra-op Plan:   Post-operative Plan:   Informed Consent: I have reviewed the patients History and Physical, chart, labs and discussed the procedure including the risks, benefits and alternatives for the proposed anesthesia with the patient or authorized representative who has indicated his/her understanding and acceptance.     Dental Advisory Given  Plan Discussed with: CRNA  Anesthesia Plan Comments:        Anesthesia Quick Evaluation

## 2022-08-15 NOTE — Discharge Instructions (Addendum)
You are being discharged to home.  Resume your previous diet.  We are waiting for your pathology results.  Continue your present medications.  Restart Plavix today

## 2022-08-16 ENCOUNTER — Encounter (INDEPENDENT_AMBULATORY_CARE_PROVIDER_SITE_OTHER): Payer: Self-pay | Admitting: *Deleted

## 2022-08-20 ENCOUNTER — Encounter (HOSPITAL_COMMUNITY): Payer: Self-pay | Admitting: Gastroenterology

## 2022-08-30 ENCOUNTER — Other Ambulatory Visit: Payer: Self-pay | Admitting: Cardiology

## 2022-10-17 ENCOUNTER — Ambulatory Visit: Payer: Medicare Other | Admitting: Cardiology

## 2022-12-12 DIAGNOSIS — Z23 Encounter for immunization: Secondary | ICD-10-CM | POA: Diagnosis not present

## 2022-12-12 DIAGNOSIS — E1169 Type 2 diabetes mellitus with other specified complication: Secondary | ICD-10-CM | POA: Diagnosis not present

## 2022-12-12 DIAGNOSIS — E785 Hyperlipidemia, unspecified: Secondary | ICD-10-CM | POA: Diagnosis not present

## 2022-12-24 DIAGNOSIS — H04123 Dry eye syndrome of bilateral lacrimal glands: Secondary | ICD-10-CM | POA: Diagnosis not present

## 2022-12-28 DIAGNOSIS — Z713 Dietary counseling and surveillance: Secondary | ICD-10-CM | POA: Diagnosis not present

## 2022-12-28 DIAGNOSIS — Z79899 Other long term (current) drug therapy: Secondary | ICD-10-CM | POA: Diagnosis not present

## 2022-12-28 DIAGNOSIS — E1169 Type 2 diabetes mellitus with other specified complication: Secondary | ICD-10-CM | POA: Diagnosis not present

## 2022-12-28 DIAGNOSIS — I25119 Atherosclerotic heart disease of native coronary artery with unspecified angina pectoris: Secondary | ICD-10-CM | POA: Diagnosis not present

## 2022-12-28 DIAGNOSIS — G4730 Sleep apnea, unspecified: Secondary | ICD-10-CM | POA: Diagnosis not present

## 2022-12-28 DIAGNOSIS — I10 Essential (primary) hypertension: Secondary | ICD-10-CM | POA: Diagnosis not present

## 2022-12-28 DIAGNOSIS — D75839 Thrombocytosis, unspecified: Secondary | ICD-10-CM | POA: Diagnosis not present

## 2022-12-28 DIAGNOSIS — Z7182 Exercise counseling: Secondary | ICD-10-CM | POA: Diagnosis not present

## 2022-12-28 DIAGNOSIS — J302 Other seasonal allergic rhinitis: Secondary | ICD-10-CM | POA: Diagnosis not present

## 2022-12-28 DIAGNOSIS — K2270 Barrett's esophagus without dysplasia: Secondary | ICD-10-CM | POA: Diagnosis not present

## 2022-12-28 DIAGNOSIS — R197 Diarrhea, unspecified: Secondary | ICD-10-CM | POA: Diagnosis not present

## 2022-12-28 DIAGNOSIS — E785 Hyperlipidemia, unspecified: Secondary | ICD-10-CM | POA: Diagnosis not present

## 2023-06-24 DIAGNOSIS — E1169 Type 2 diabetes mellitus with other specified complication: Secondary | ICD-10-CM | POA: Diagnosis not present

## 2023-06-24 DIAGNOSIS — E785 Hyperlipidemia, unspecified: Secondary | ICD-10-CM | POA: Diagnosis not present

## 2023-06-28 ENCOUNTER — Encounter: Payer: Self-pay | Admitting: Internal Medicine

## 2023-06-28 DIAGNOSIS — K2270 Barrett's esophagus without dysplasia: Secondary | ICD-10-CM | POA: Diagnosis not present

## 2023-06-28 DIAGNOSIS — E785 Hyperlipidemia, unspecified: Secondary | ICD-10-CM | POA: Diagnosis not present

## 2023-06-28 DIAGNOSIS — Z23 Encounter for immunization: Secondary | ICD-10-CM | POA: Diagnosis not present

## 2023-06-28 DIAGNOSIS — I10 Essential (primary) hypertension: Secondary | ICD-10-CM | POA: Diagnosis not present

## 2023-06-28 DIAGNOSIS — G4730 Sleep apnea, unspecified: Secondary | ICD-10-CM | POA: Diagnosis not present

## 2023-06-28 DIAGNOSIS — J302 Other seasonal allergic rhinitis: Secondary | ICD-10-CM | POA: Diagnosis not present

## 2023-06-28 DIAGNOSIS — I25119 Atherosclerotic heart disease of native coronary artery with unspecified angina pectoris: Secondary | ICD-10-CM | POA: Diagnosis not present

## 2023-06-28 DIAGNOSIS — R197 Diarrhea, unspecified: Secondary | ICD-10-CM | POA: Diagnosis not present

## 2023-06-28 DIAGNOSIS — E1169 Type 2 diabetes mellitus with other specified complication: Secondary | ICD-10-CM | POA: Diagnosis not present

## 2023-06-28 DIAGNOSIS — D75839 Thrombocytosis, unspecified: Secondary | ICD-10-CM | POA: Diagnosis not present

## 2023-07-08 ENCOUNTER — Ambulatory Visit
Admission: EM | Admit: 2023-07-08 | Discharge: 2023-07-08 | Disposition: A | Discharge status: Home / Self Care | Attending: Family Medicine | Admitting: Family Medicine

## 2023-07-08 DIAGNOSIS — L089 Local infection of the skin and subcutaneous tissue, unspecified: Secondary | ICD-10-CM

## 2023-07-08 DIAGNOSIS — S60451A Superficial foreign body of left index finger, initial encounter: Secondary | ICD-10-CM

## 2023-07-08 MED ORDER — MUPIROCIN 2 % EX OINT: 1.0000 | TOPICAL_OINTMENT | Freq: Two times a day (BID) | CUTANEOUS | 0 refills | Status: DC

## 2023-07-08 MED ORDER — CHLORHEXIDINE GLUCONATE 4 % EX SOLN: Freq: Every day | CUTANEOUS | 0 refills | Status: DC | PRN

## 2023-07-08 NOTE — ED Triage Notes (Signed)
 Pt states Saturday he noticed redness and pain to his left middle finger. Now has a red spot next to nail that may be a insect bite or splinter.

## 2023-07-08 NOTE — Discharge Instructions (Signed)
 Clean the area twice daily with Hibiclens and apply the mupirocin  ointment and a dressing.  Keep the area dressed and clean like this until it is fully healed.  Follow-up for significantly worsening symptoms at any time.

## 2023-07-08 NOTE — ED Provider Notes (Signed)
 [facility]    CSN: [id number] Arrival date & time: 07/08/23  9175      History   Chief Complaint Chief Complaint  Patient presents with   Insect Bite   Foreign Body in Skin    HPI Samuel Tanner is a 73 y.o. male.   Patient presenting today with a painful swollen area to the left distal index finger.  States he was doing some yard work and had a splinter in the finger but got that out.  Over the next 2 days noticed pain and swelling to the area and thinks he may see another piece of something stuck in the area.  Denies numbness, tingling, decreased range of motion, fever, chills.  So far not trying anything over-the-counter for symptoms.    Past Medical History:  Diagnosis Date   Appendiceal carcinoid tumor    resection  15 yrs ago   Barrett esophagus 03/29/2013   Preformed by Dr.Rourk   Chest tightness, discomfort, or pressure    Coronary artery disease    a. s/p prior stent to LCx b. patent stent by cath in 2009 c. low-risk NST in 06/2013   Hyperlipidemia    mixed   MI (myocardial infarction) [facility])    Sleep apnea    Ulcer     Patient Active Problem List   Diagnosis Date Noted   Hx of colonic polyps 09/15/2018   Family hx of colon cancer 09/15/2018   CAD (coronary artery disease) 07/01/2013   GERD (gastroesophageal reflux disease) 04/27/2013   Barrett's esophagus without dysplasia 04/27/2013   Acute blood loss anemia 03/29/2013   Leukocytosis 03/29/2013   GI bleed 03/28/2013   Hyperlipidemia    Appendiceal carcinoid tumor    HYPERLIPIDEMIA-MIXED 07/05/2008    Past Surgical History:  Procedure Laterality Date    appendiceal carcinoid   1996   APPENDECTOMY  1996   BIOPSY  08/15/2022   Procedure: BIOPSY;  Surgeon: Eartha Angelia Sieving, MD;  Location: [facility];  Service: Gastroenterology;;   CARDIAC CATHETERIZATION     06/27/2007 and 09/17/2001   CHOLECYSTECTOMY  1998   COLONOSCOPY     COLONOSCOPY N/A 07/30/2013   Procedure:  COLONOSCOPY;  Surgeon: Claudis RAYMOND Rivet, MD;  Location: [facility];  Service: Endoscopy;  Laterality: N/A;  1030   COLONOSCOPY N/A 11/05/2018   Procedure: COLONOSCOPY;  Surgeon: Rivet Claudis RAYMOND, MD;  Location: [facility];  Service: Endoscopy;  Laterality: N/A;  1200   ESOPHAGOGASTRODUODENOSCOPY N/A 03/29/2013   Procedure: ESOPHAGOGASTRODUODENOSCOPY (EGD);  Surgeon: Lamar CHRISTELLA Hollingshead, MD;  Location: [facility];  Service: Endoscopy;  Laterality: N/A;   ESOPHAGOGASTRODUODENOSCOPY (EGD) WITH PROPOFOL  N/A 08/15/2022   Procedure: ESOPHAGOGASTRODUODENOSCOPY (EGD) WITH PROPOFOL ;  Surgeon: Eartha Angelia Sieving, MD;  Location: [facility];  Service: Gastroenterology;  Laterality: N/A;  8:00am;asa 1   HERNIA REPAIR     NASAL SEPTOPLASTY W/ TURBINOPLASTY Bilateral 01/12/2014   Procedure: BILATERAL TURBINATE RESECTION/SEPTOPLASTY ;  Surgeon: Ana LELON Moccasin, MD;  Location: [facility];  Service: ENT;  Laterality: Bilateral;   SPLENECTOMY  1996   along with colon resection   TONSILLECTOMY     UPPER GASTROINTESTINAL ENDOSCOPY     03/29/13       Home Medications    Prior to Admission medications   Medication Sig Start Date End Date Taking? Authorizing Provider  acetaminophen  (TYLENOL ) 500 MG tablet Take 500 mg by mouth every 6 (six) hours as needed for moderate pain or headache.  Yes [provider]  chlorhexidine (HIBICLENS) 4 % external liquid Apply topically daily as needed. 07/08/23  Yes Stuart Vernell Norris, PA-C  cholestyramine ORMA) 4 GM/DOSE powder Take 4 g by mouth daily.  03/23/14  Yes [provider]  clopidogrel  (PLAVIX ) 75 MG tablet Take 1 tablet by mouth once daily 08/30/22  Yes Branch, Dorn FALCON, MD  loratadine (CLARITIN) 10 MG tablet Take 10 mg by mouth daily.   Yes [provider]  losartan (COZAAR) 50 MG tablet Take 50 mg by mouth daily. 06/19/22  Yes [provider]  mupirocin  ointment (BACTROBAN ) 2 % Apply 1 Application  topically 2 (two) times daily. 07/08/23  Yes Stuart Vernell Norris, PA-C  pantoprazole  (PROTONIX ) 40 MG tablet Take 1 tablet (40 mg total) by mouth daily before breakfast. 08/02/22  Yes Eartha Flavors, Toribio, MD  Probiotic Product (PROBIOTIC DAILY PO) Take 1 capsule by mouth daily.    Yes [provider]  rosuvastatin  (CRESTOR ) 20 MG tablet Take 1 tablet (20 mg total) by mouth daily. 08/09/22  Yes Strader, [place] M, PA-C  diphenhydramine -acetaminophen  (TYLENOL  PM) 25-500 MG TABS tablet Take 1 tablet by mouth at bedtime as needed (sleep).    [provider]  nitroGLYCERIN  (NITROSTAT ) 0.4 MG SL tablet Place 1 tablet (0.4 mg total) under the tongue every 5 (five) minutes as needed for chest pain. 07/29/13   Alvan Dorn FALCON, MD    Family History Family History  Problem Relation Age of Onset   Heart attack Father    Cancer Father        prostate cancer   Heart disease Father    Colon cancer Father     Social History Social History   Tobacco Use   Smoking status: Never   Smokeless tobacco: Never  Vaping Use   Vaping status: Never Used  Substance Use Topics   Alcohol use: No    Alcohol/week: 0.0 standard drinks of alcohol   Drug use: No     Allergies   Asa [aspirin] and Morphine and codeine   Review of Systems Review of Systems Per HPI  Physical Exam Triage Vital Signs ED Triage Vitals  Encounter Vitals Group     BP 07/08/23 0839 113/70     Girls Systolic BP Percentile --      Girls Diastolic BP Percentile --      Boys Systolic BP Percentile --      Boys Diastolic BP Percentile --      Pulse Rate 07/08/23 0839 84     Resp 07/08/23 0839 16     Temp 07/08/23 0839 (!) 97.5 F (36.4 C)     Temp Source 07/08/23 0839 Oral     SpO2 07/08/23 0839 92 %     Weight --      Height --      Head Circumference --      Peak Flow --      Pain Score 07/08/23 0841 0     Pain Loc --      Pain Education --      Exclude from Growth Chart --    No data  found.  Updated Vital Signs BP 113/70 (BP Location: Right Arm)   Pulse 84   Temp (!) 97.5 F (36.4 C) (Oral)   Resp 16   SpO2 92%   Visual Acuity Right Eye Distance:   Left Eye Distance:   Bilateral Distance:    Right Eye Near:   Left Eye Near:  Bilateral Near:     Physical Exam Vitals and nursing note reviewed.  Constitutional:      Appearance: Normal appearance.  HENT:     Head: Atraumatic.     Mouth/Throat:     Mouth: Mucous membranes are moist.   Eyes:     Extraocular Movements: Extraocular movements intact.     Conjunctiva/sclera: Conjunctivae normal.    Cardiovascular:     Rate and Rhythm: Normal rate.  Pulmonary:     Effort: Pulmonary effort is normal.   Musculoskeletal:        General: Normal range of motion.     Cervical back: Normal range of motion and neck supple.   Skin:    General: Skin is warm and dry.     Comments: Small pustular lesion to the distal left index finger with small splinter embedded.  This was removed with forceps without complication   Neurological:     Mental Status: He is oriented to person, place, and time.     Comments: Left upper extremity neurovascularly intact  Psychiatric:        Mood and Affect: Mood normal.        Thought Content: Thought content normal.        Judgment: Judgment normal.      UC Treatments / Results  Labs (all labs ordered are listed, but only abnormal results are displayed) Labs Reviewed - No data to display  EKG   Radiology No results found.  Procedures Procedures (including critical care time)  Medications Ordered in UC Medications - No data to display  Initial Impression / Assessment and Plan / UC Course  I have reviewed the triage vital signs and the nursing notes.  Pertinent labs & imaging results that were available during my care of the patient were reviewed by me and considered in my medical decision making (see chart for details).     Small foreign body removed from  left index finger likely causing the pustular lesion.  Will treat topically with Hibiclens, mupirocin , good dressing changes.  Return for worsening symptoms.  Final Clinical Impressions(s) / UC Diagnoses   Final diagnoses:  Finger infection  Foreign body in skin of left index finger     Discharge Instructions      Clean the area twice daily with Hibiclens and apply the mupirocin  ointment and a dressing.  Keep the area dressed and clean like this until it is fully healed.  Follow-up for significantly worsening symptoms at any time.    ED Prescriptions     Medication Sig Dispense Auth. Provider   mupirocin  ointment (BACTROBAN ) 2 % Apply 1 Application topically 2 (two) times daily. 22 g Stuart Vernell Norris, PA-C   chlorhexidine (HIBICLENS) 4 % external liquid Apply topically daily as needed. 236 mL Stuart Vernell Norris, [place]      PDMP not reviewed this encounter.   Stuart Vernell [place], PA-C 07/08/23 [phone]

## 2023-08-01 ENCOUNTER — Ambulatory Visit (INDEPENDENT_AMBULATORY_CARE_PROVIDER_SITE_OTHER): Payer: Medicare Other | Admitting: Gastroenterology

## 2023-08-05 ENCOUNTER — Ambulatory Visit (INDEPENDENT_AMBULATORY_CARE_PROVIDER_SITE_OTHER): Payer: Medicare Other | Admitting: Gastroenterology

## 2023-09-05 ENCOUNTER — Encounter (INDEPENDENT_AMBULATORY_CARE_PROVIDER_SITE_OTHER): Payer: Self-pay | Admitting: Gastroenterology

## 2023-09-05 ENCOUNTER — Ambulatory Visit (INDEPENDENT_AMBULATORY_CARE_PROVIDER_SITE_OTHER): Admitting: Gastroenterology

## 2023-09-05 VITALS — BP 138/74 | HR 73 | Temp 97.8°F | Ht 73.0 in | Wt 215.5 lb

## 2023-09-05 DIAGNOSIS — Z860100 Personal history of colon polyps, unspecified: Secondary | ICD-10-CM

## 2023-09-05 DIAGNOSIS — Z800 Family history of malignant neoplasm of digestive organs: Secondary | ICD-10-CM

## 2023-09-05 DIAGNOSIS — K219 Gastro-esophageal reflux disease without esophagitis: Secondary | ICD-10-CM

## 2023-09-05 DIAGNOSIS — K2270 Barrett's esophagus without dysplasia: Secondary | ICD-10-CM

## 2023-09-05 MED ORDER — PANTOPRAZOLE SODIUM 40 MG PO TBEC: 40.0000 mg | DELAYED_RELEASE_TABLET | Freq: Every day | ORAL | 3 refills | Status: AC

## 2023-09-05 NOTE — Patient Instructions (Addendum)
-  continue protonix  40mg  daily -Avoid greasy, spicy, fried, citrus foods, and be mindful that caffeine, carbonated drinks, chocolate and alcohol can increase reflux symptoms Stay upright 2-3 hours after eating, prior to lying down and avoid eating late in the evenings. -schedule colonoscopy for October   Follow up 1 year  It was a pleasure to see you today. I want to create trusting relationships with patients and provide genuine, compassionate, and quality care. I truly value your feedback! please be on the lookout for a survey regarding your visit with me today. I appreciate your input about our visit and your time in completing this!    Jacquline Terrill L. Bilbo Carcamo, MSN, APRN, AGNP-C Adult-Gerontology Nurse Practitioner [facility] at [facility]

## 2023-09-05 NOTE — Progress Notes (Signed)
 Referring Provider: Shona Norleen PEDLAR, MD Primary Care Physician:  Shona Norleen PEDLAR, MD Primary GI Physician: Dr. Eartha   Chief Complaint  Patient presents with   Follow-up    Pt arrives for follow up. Pt states no issues at this time. Pt is due for TCS; last TCS with Dr.Rehman 11/05/2018   HPI:   Samuel Tanner is a 73 y.o. 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 Eartha. At that time, previously taking protonix  40mg  daily but due to no return follow up he ran out of medication. Has heartburn maybe once per week, taking tums or gaviscon.   Recommended restart protonix  40mg  daily, schedule EGD, hold plavix  x5 days, clearance prior to  Present:  GERD well controlled on protonix  40mg  daily. Denies breakthrough, dysphagia or odynophagia. No nausea, vomiting or early satiety.   States that when he had GB removed in the past he started having diarrhea and started cholestyramine which he has continued on, this seems to help keep stools more firmed up. Having a BM daily without rectal bleeding or melena. Denies changes in appetite or weight loss. No changes in bowel habits.    Pertinent history: Based on previous notes from Dr. Golda submucosal duodenal lesion which previously has been evaluated at [facility] and felt to be Jolanda gland hypertrophy(EUS with FNA in August 2011 and redo in May 2012 at [facility]). Dr. Eartha reviewed the evaluation performed by Dr. Missie in May 2012.  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   Last EGD: 07/2022: - Esophageal mucosal changes classified as                            Barrett's stage C0-M1 per Prague criteria. Biopsied.                           - 3 cm hiatal hernia.                           - Normal stomach.                           - Submucosal nodule found in the duodenum. Biopsied. A. DUODENUM, SUBMUCOSAL LESION, BIOPSY:  - Benign small bowel mucosa with foveolar metaplasia, suggestive of  peptic injury (see comment)   B. ESOPHAGUS, AT 39 CM, BIOPSY:  - Barrett mucosa, no dysplasia   Last colonoscopy: 11/05/2018 Colocolonic anastomosis at 25 cm from the anus, diverticulosis  Repeat colonoscopy 2025 Repeat EGD 2029   Filed Weights   09/05/23 0820  Weight: 215 lb 8 oz (97.8 kg)     Past Medical History:  Diagnosis Date   Appendiceal carcinoid tumor    resection  15 yrs ago   Barrett esophagus 03/29/2013   Preformed by Dr.Rourk   Chest tightness, discomfort, or pressure  Coronary artery disease    a. s/p prior stent to LCx b. patent stent by cath in 2009 c. low-risk NST in 06/2013   Hyperlipidemia    mixed   MI (myocardial infarction) [facility])    Sleep apnea    Ulcer     Past Surgical History:  Procedure Laterality Date    appendiceal carcinoid   1996   APPENDECTOMY  1996   BIOPSY  08/15/2022   Procedure: BIOPSY;  Surgeon: Eartha Angelia Sieving, MD;  [facility];  Service: Gastroenterology;;   CARDIAC CATHETERIZATION     06/27/2007 and 09/17/2001   CHOLECYSTECTOMY  1998   COLONOSCOPY     COLONOSCOPY N/A 07/30/2013   Procedure: COLONOSCOPY;  Surgeon: Claudis RAYMOND Rivet, MD;  [facility];  Service: Endoscopy;  Laterality: N/A;  1030   COLONOSCOPY N/A 11/05/2018   Procedure: COLONOSCOPY;  Surgeon: Rivet Claudis RAYMOND, MD;  [facility];  Service: Endoscopy;  Laterality: N/A;  1200   ESOPHAGOGASTRODUODENOSCOPY N/A  03/29/2013   Procedure: ESOPHAGOGASTRODUODENOSCOPY (EGD);  Surgeon: Lamar CHRISTELLA Hollingshead, MD;  [facility];  Service: Endoscopy;  Laterality: N/A;   ESOPHAGOGASTRODUODENOSCOPY (EGD) WITH PROPOFOL  N/A 08/15/2022   Procedure: ESOPHAGOGASTRODUODENOSCOPY (EGD) WITH PROPOFOL ;  Surgeon: Eartha Angelia Sieving, MD;  [facility];  Service: Gastroenterology;  Laterality: N/A;  8:00am;asa 1   HERNIA REPAIR     NASAL SEPTOPLASTY W/ TURBINOPLASTY Bilateral 01/12/2014   Procedure: BILATERAL TURBINATE RESECTION/SEPTOPLASTY ;  Surgeon: Ana LELON Moccasin, MD;  Location: [facility];  Service: ENT;  Laterality: Bilateral;   SPLENECTOMY  1996   along with colon resection   TONSILLECTOMY     UPPER GASTROINTESTINAL ENDOSCOPY     03/29/13    Current Outpatient Medications  Medication Sig Dispense Refill   acetaminophen  (TYLENOL ) 500 MG tablet Take 500 mg by mouth every 6 (six) hours as needed for moderate pain or headache.     cholestyramine (QUESTRAN) 4 GM/DOSE powder Take 4 g by mouth daily.      clopidogrel  (PLAVIX ) 75 MG tablet Take 1 tablet by mouth once daily 90 tablet 2   diphenhydramine -acetaminophen  (TYLENOL  PM) 25-500 MG TABS tablet Take 1 tablet by mouth at bedtime as needed (sleep).     loratadine (CLARITIN) 10 MG tablet Take 10 mg by mouth daily.     losartan (COZAAR) 50 MG tablet Take 50 mg by mouth daily.     nitroGLYCERIN  (NITROSTAT ) 0.4 MG SL tablet Place 1 tablet (0.4 mg total) under the tongue every 5 (five) minutes as needed for chest pain. 25 tablet 4   pantoprazole  (PROTONIX ) 40 MG tablet Take 1 tablet (40 mg total) by mouth daily before breakfast. 90 tablet 3   Probiotic Product (PROBIOTIC DAILY PO) Take 1 capsule by mouth daily.      rosuvastatin  (CRESTOR ) 20 MG tablet Take 1 tablet (20 mg total) by mouth daily. 90 tablet 3   No current facility-administered medications for this visit.    Allergies as of 09/05/2023 - Review Complete 09/05/2023  Allergen  Reaction Noted   Asa [aspirin]  04/09/2012   Morphine and codeine  01/17/2011    Social History   Socioeconomic History   Marital status: Married    Spouse name: Not on file   Number of children: Not on file   Years of education: Not on file   Highest education level: Not on file  Occupational History   Occupation: part time    Employer: RETIRED  Comment: pharmacist  Tobacco Use   Smoking status: Never   Smokeless tobacco: Never  Vaping Use   Vaping status: Never Used  Substance and Sexual Activity   Alcohol use: No    Alcohol/week: 0.0 standard drinks of alcohol   Drug use: No   Sexual activity: Not on file  Other Topics Concern   Not on file  Social History Narrative   Not on file   Social Drivers of Health   Financial Resource Strain: Not on file  Food Insecurity: Not on file  Transportation Needs: Not on file  Physical Activity: Not on file  Stress: Not on file  Social Connections: Not on file    Review of systems General: negative for malaise, night sweats, fever, chills, weight loss Neck: Negative for lumps, goiter, pain and significant neck swelling Resp: Negative for cough, wheezing, dyspnea at rest CV: Negative for chest pain, leg swelling, palpitations, orthopnea GI: denies melena, hematochezia, nausea, vomiting, diarrhea, constipation, dysphagia, odyonophagia, early satiety or unintentional weight loss.  MSK: Negative for joint pain or swelling, back pain, and muscle pain. Derm: Negative for itching or rash Psych: Denies depression, anxiety, memory loss, confusion. No homicidal or suicidal ideation.  Heme: Negative for prolonged bleeding, bruising easily, and swollen nodes. Endocrine: Negative for cold or heat intolerance, polyuria, polydipsia and goiter. Neuro: negative for tremor, gait imbalance, syncope and seizures. The remainder of the review of systems is noncontributory.  Physical Exam: BP 138/74   Pulse 73   Temp 97.8 F (36.6 C)   Ht  6' 1 (1.854 m)   Wt 215 lb 8 oz (97.8 kg)   BMI 28.43 kg/m  General:   Alert and oriented. No distress noted. Pleasant and cooperative.  Head:  Normocephalic and atraumatic. Eyes:  Conjuctiva clear without scleral icterus. Mouth:  Oral mucosa pink and moist. Good dentition. No lesions. Heart: Normal rate and rhythm, s1 and s2 heart sounds present.  Lungs: Clear lung sounds in all lobes. Respirations equal and unlabored. Abdomen:  +BS, soft, non-tender and non-distended. No rebound or guarding. No HSM or masses noted. Derm: No palmar erythema or jaundice Msk:  Symmetrical without gross deformities. Normal posture. Extremities:  Without edema. Neurologic:  Alert and  oriented x4 Psych:  Alert and cooperative. Normal mood and affect.  Invalid input(s): 6 MONTHS   ASSESSMENT: Samuel Tanner is a 73 y.o. male presenting today for follow up of GERD with BE and to schedule colonoscopy due to family history of CRC and personal history of colon polyps  GERD with Barrett's esophagus without dysplasia: EGD up to date as of July 2024, due for repeat in 2029. GERD well controlled on protonix  40mg  daily without breakthrough symptoms. Will continue with current PPI regimen, good reflux precautions  Family history of CRC, personal history of colon polyps: last TCS in October 2020, he will be due for surveillance TCS in October of this year. We will get him scheduled for this. Denies any change in bowel habits, rectal bleeding or melena. Indications, risks and benefits of procedure discussed in detail with patient. Patient verbalized understanding and is in agreement to proceed with Colonoscopy.    PLAN:  -continue protonix  40mg  daily -good reflux precautions -schedule colonoscopy, ASA III, plavix  hold x5 days will require cards clearance  All questions were answered, patient verbalized understanding and is in agreement with plan as outlined above.   Follow Up: 1 year   Armiyah Capron L. Jazminn Pomales, MSN,  APRN, AGNP-C Adult-Gerontology Nurse Practitioner [facility]  I have reviewed the note and agree with the APP's assessment as described in this progress note  [name], MD Gastroenterology and Hepatology [facility] Gastroenterology

## 2023-09-26 ENCOUNTER — Telehealth: Payer: Self-pay | Admitting: *Deleted

## 2023-09-26 NOTE — Telephone Encounter (Signed)
   Name: Samuel Tanner  DOB: 1950-10-17  MRN: [id number]  Primary Cardiologist: Alvan Carrier, MD  Chart reviewed as part of pre-operative protocol coverage. Because of Samuel Tanner's past medical history and time since last visit, he will require a follow-up in-office visit in order to better assess preoperative cardiovascular risk.  Pre-op covering staff: - Please schedule appointment and call patient to inform them. If patient already had an upcoming appointment within acceptable timeframe, please add pre-op clearance to the appointment notes so provider is aware. - Please contact requesting surgeon's office via preferred method (i.e, phone, fax) to inform them of need for appointment prior to surgery.   I don't know the details of his prior stenting (location, length). Pt is unable to take ASA due to GI complications and has been on plavix  monotherapy. Pt previously cleared to hold plavix  for 5 days (during last office visit).      Jon Garre Darian Ace, PA  09/26/2023, 10:27 AM

## 2023-09-26 NOTE — Telephone Encounter (Signed)
 Pt has appt IN OFFICE 10/08/23 with Dorn Ross, MD  Will update appt notes to include preop clearance.  Will update the surgeons office

## 2023-09-26 NOTE — Telephone Encounter (Signed)
    09/26/23  Samuel Tanner [birth year]  What type of surgery is being performed? COLONOSCOPY  When is surgery scheduled? TBD  What type of clearance is required (medical or pharmacy to hold medication or both? BOTH  Are there any medications that need to be held prior to surgery and how long? PLAVIX  X 5 DAYS PRIOR  Name of physician performing surgery?  Dr. EARTHA Rouse [facility]astroenterology at [facility] Phone: [phone] Fax: [phone]  Anethesia type (none, local, MAC, general)? MAC

## 2023-10-03 ENCOUNTER — Ambulatory Visit (INDEPENDENT_AMBULATORY_CARE_PROVIDER_SITE_OTHER): Admitting: Gastroenterology

## 2023-10-03 ENCOUNTER — Encounter (INDEPENDENT_AMBULATORY_CARE_PROVIDER_SITE_OTHER): Payer: Self-pay | Admitting: Gastroenterology

## 2023-10-03 VITALS — BP 140/83 | HR 66 | Temp 97.1°F | Ht 73.0 in | Wt 216.6 lb

## 2023-10-03 DIAGNOSIS — K219 Gastro-esophageal reflux disease without esophagitis: Secondary | ICD-10-CM

## 2023-10-03 DIAGNOSIS — R197 Diarrhea, unspecified: Secondary | ICD-10-CM | POA: Diagnosis not present

## 2023-10-03 DIAGNOSIS — R194 Change in bowel habit: Secondary | ICD-10-CM | POA: Diagnosis not present

## 2023-10-03 DIAGNOSIS — K9089 Other intestinal malabsorption: Secondary | ICD-10-CM

## 2023-10-03 DIAGNOSIS — K2270 Barrett's esophagus without dysplasia: Secondary | ICD-10-CM | POA: Diagnosis not present

## 2023-10-03 NOTE — H&P (View-Only) (Signed)
 [name], M.D. Gastroenterology & Hepatology [facility] Gastroenterology [address] [place], [place] [address]  Primary Care Physician: Shona Norleen PEDLAR, MD [address] [place] [address]  I will communicate my assessment and recommendations to the referring MD via EMR.  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  The patient was last seen on 09/05/2023. At that time, the patient was scheduled for colonoscopy -he is waiting to have evaluation at Dr. Alvan office for clearance (appointment on 10/08/2023).  He was continued on Protonix  40 mg daily.  States that 3 weeks ago he developed new onset of diarrhea. Reports that he is having now 3 Bms per day, usually after having a meal. Has not identified a specific type of food causing this.Bowel movements are mushy now, can vary in consistency. He was having one BM daily prior to the onset of symptoms. States now having some mild abdominal pain lower abdomen prior to having a bowel movement.  The patient denies having any nausea, vomiting, fever, chills, hematochezia, melena, hematemesis, abdominal distention,   jaundice, pruritus or weight loss. No sick contacts or recent travel.  He has been taking Imodium 1 pill a day for the last 3 days, which has helped  some as he has had less bowel movements but has had gas.  It appears that he has been prescribed cholestyramine since he underwent  cholecystectomy. He takes this once a day.   Last EGD: 07/2022: - Esophageal mucosal changes classified as                            Barrett's stage C0-M1 per Prague criteria. Biopsied.                           - 3 cm hiatal hernia.                           - Normal stomach.                           - Submucosal nodule found in the duodenum. Biopsied. A. DUODENUM, SUBMUCOSAL LESION, BIOPSY:  - Benign small bowel mucosa with foveolar metaplasia, suggestive of  peptic injury (see comment)   B. ESOPHAGUS, AT 39 CM, BIOPSY:  - Barrett mucosa, no dysplasia    Last colonoscopy: 11/05/2018 Colocolonic anastomosis at 25 cm from the anus, diverticulosis   Repeat colonoscopy 2025 Repeat EGD 2029  Past Medical History: Past Medical History:  Diagnosis Date   Appendiceal carcinoid tumor    resection  15 yrs ago   Barrett esophagus 03/29/2013   Preformed by Dr.Rourk   Chest tightness, discomfort, or pressure    Coronary artery disease    a. s/p prior stent to LCx b. patent stent by cath in 2009 c. low-risk NST in 06/2013   Hyperlipidemia    mixed   MI (myocardial infarction) ([facility])  Sleep apnea    Ulcer     Past Surgical History: Past Surgical History:  Procedure Laterality Date    appendiceal carcinoid   1996   APPENDECTOMY  1996   BIOPSY  08/15/2022   Procedure: BIOPSY;  Surgeon: Eartha Angelia Sieving, MD;  [facility];  Service: Gastroenterology;;   CARDIAC CATHETERIZATION     06/27/2007 and 09/17/2001   CHOLECYSTECTOMY  1998   COLONOSCOPY     COLONOSCOPY N/A 07/30/2013   Procedure: COLONOSCOPY;  Surgeon: Claudis RAYMOND Rivet, MD;  [facility];  Service: Endoscopy;  Laterality: N/A;  1030   COLONOSCOPY N/A 11/05/2018   Procedure: COLONOSCOPY;  Surgeon: Rivet Claudis RAYMOND, MD;  [facility];  Service: Endoscopy;  Laterality: N/A;  1200   ESOPHAGOGASTRODUODENOSCOPY N/A 03/29/2013   Procedure: ESOPHAGOGASTRODUODENOSCOPY (EGD);  Surgeon: Lamar CHRISTELLA Hollingshead, MD;  [facility];  Service: Endoscopy;  Laterality: N/A;   ESOPHAGOGASTRODUODENOSCOPY (EGD) WITH PROPOFOL  N/A 08/15/2022   Procedure: ESOPHAGOGASTRODUODENOSCOPY (EGD) WITH PROPOFOL ;  Surgeon: Eartha Angelia Sieving, MD;  [facility];  Service: Gastroenterology;  Laterality: N/A;  8:00am;asa 1   HERNIA REPAIR     NASAL SEPTOPLASTY W/ TURBINOPLASTY Bilateral 01/12/2014   Procedure: BILATERAL TURBINATE RESECTION/SEPTOPLASTY ;  Surgeon: Ana LELON Moccasin, MD;  Location: [facility];  Service: ENT;  Laterality: Bilateral;   SPLENECTOMY  1996   along with colon resection   TONSILLECTOMY     UPPER GASTROINTESTINAL ENDOSCOPY     03/29/13    Family History: Family History  Problem Relation Age of Onset   Heart attack Father    Cancer Father        prostate cancer   Heart disease Father    Colon cancer Father     Social History: Social History   Tobacco Use  Smoking Status Never  Smokeless Tobacco Never   Social History   Substance and Sexual Activity  Alcohol Use No   Alcohol/week: 0.0 standard drinks of alcohol   Social History   Substance and Sexual Activity  Drug Use No    Allergies: Allergies  Allergen Reactions   Asa [Aspirin]     PT HAS PEPTIC ULCER   Morphine And Codeine     hallucinations    Medications: Current Outpatient Medications  Medication Sig Dispense Refill   acetaminophen  (TYLENOL ) 500 MG tablet Take 500 mg by mouth every 6 (six) hours as needed for moderate pain or headache.     cholestyramine (QUESTRAN) 4 GM/DOSE powder Take 4 g by mouth daily.      clopidogrel  (PLAVIX ) 75 MG tablet Take 1 tablet by mouth once daily 90 tablet 2   diphenhydramine -acetaminophen  (TYLENOL  PM) 25-500 MG TABS tablet Take 1 tablet by mouth at bedtime as needed (sleep).     loratadine (CLARITIN) 10 MG tablet Take 10 mg by mouth daily.     losartan (COZAAR) 50 MG tablet Take 50 mg by mouth daily.     nitroGLYCERIN  (NITROSTAT ) 0.4 MG  SL tablet Place 1 tablet (0.4 mg total) under the tongue every 5 (five) minutes as needed for chest pain. 25 tablet 4   pantoprazole  (PROTONIX ) 40 MG tablet Take 1 tablet (40 mg total) by mouth daily before breakfast. 90 tablet 3   Probiotic Product (PROBIOTIC DAILY PO) Take 1 capsule by mouth daily.      rosuvastatin  (CRESTOR ) 20 MG tablet Take 1 tablet (20 mg total) by mouth daily. 90 tablet 3   No  current facility-administered medications for this visit.    Review of Systems: GENERAL: negative for malaise, night sweats HEENT: No changes in hearing or vision, no nose bleeds or other nasal problems. NECK: Negative for lumps, goiter, pain and significant neck swelling RESPIRATORY: Negative for cough, wheezing CARDIOVASCULAR: Negative for chest pain, leg swelling, palpitations, orthopnea GI: SEE HPI MUSCULOSKELETAL: Negative for joint pain or swelling, back pain, and muscle pain. SKIN: Negative for lesions, rash PSYCH: Negative for sleep disturbance, mood disorder and recent psychosocial stressors. HEMATOLOGY Negative for prolonged bleeding, bruising easily, and swollen nodes. ENDOCRINE: Negative for cold or heat intolerance, polyuria, polydipsia and goiter. NEURO: negative for tremor, gait imbalance, syncope and seizures. The remainder of the review of systems is noncontributory.   Physical Exam: BP (!) 140/83 (BP Location: Left Arm, Patient Position: Sitting, Cuff Size: Large)   Pulse 66   Temp (!) 97.1 F (36.2 C) (Temporal)   Ht 6' 1 (1.854 m)   Wt 216 lb 9.6 oz (98.2 kg)   BMI 28.58 kg/m  GENERAL: The patient is AO x3, in no acute distress. HEENT: Head is normocephalic and atraumatic. EOMI are intact. Mouth is well hydrated and without lesions. NECK: Supple. No masses LUNGS: Clear to auscultation. No presence of rhonchi/wheezing/rales. Adequate chest expansion HEART: RRR, normal s1 and s2. ABDOMEN: Soft, nontender, no guarding, no peritoneal signs, and nondistended. BS +.  No masses. EXTREMITIES: Without any cyanosis, clubbing, rash, lesions or edema. NEUROLOGIC: AOx3, no focal motor deficit. SKIN: no jaundice, no rashes  Imaging/Labs: as above  I personally reviewed and interpreted the available labs, imaging and endoscopic files.  Impression and Plan: ADRIELL POLANSKY is a 73 y.o. 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  Patient has presented chronic history of diarrhea which has been managed with bile salt binders after you have diarrhea with cholecystectomy.  This was controlled for multiple years but more recently he has presented increased amount of bowel movements.  Due to these, we discussed the need to rule out infectious etiologies with stool based testing, but will also check for thyroid abnormalities and celiac disease. For now, will continue same dosage of cholestyramine.  He should also proceed with surveillance colonoscopy after undergoing cardiology evaluation soon. If still having diarrhea and negative workup, will need to obtain random biopsies at that time.  His GERD appears to be controlled, we will continue pantoprazole  at the same dosage.  -Perform blood and stool workup -Hold off on taking Imodium until we receive the results of stool testing. -Proceed with cardiology visit next week to obtain clearance for colonoscopy and holding Plavix  -Continue cholestyramine 4 g daily -Continue pantoprazole  40 mg daily  All questions were answered.      [name], MD Gastroenterology and Hepatology [facility] Gastroenterology

## 2023-10-03 NOTE — Progress Notes (Unsigned)
 Samuel Tanner, M.D. Gastroenterology & Hepatology [facility] Gastroenterology [address] [place], [place] [address]  Primary Care Physician: Shona Norleen PEDLAR, MD [address] [place] [address]  I will communicate my assessment and recommendations to the referring MD via EMR.  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  The patient was last seen on 09/05/2023. At that time, the patient was scheduled for colonoscopy -he is waiting to have evaluation at Dr. Alvan office for clearance (appointment on 10/08/2023).  He was continued on Protonix  40 mg daily.  States that 3 weeks ago he developed new onset of diarrhea. Reports that he is having now 3 Bms per day, usually after having a meal. Has not identified a specific type of food causing this.Bowel movements are mushy now, can vary in consistency. He was having one BM daily prior to the onset of symptoms. States now having some mild abdominal pain lower abdomen prior to having a bowel movement.  The patient denies having any nausea, vomiting, fever, chills, hematochezia, melena, hematemesis, abdominal distention,   jaundice, pruritus or weight loss. No sick contacts or recent travel.  He has been taking Imodium 1 pill a day for the last 3 days, which has helped  some as he has had less bowel movements but has had gas.  It appears that he has been prescribed cholestyramine since he underwent  cholecystectomy. He takes this once a day.   Last EGD: 07/2022: - Esophageal mucosal changes classified as                            Barrett's stage C0-M1 per Prague criteria. Biopsied.                           - 3 cm hiatal hernia.                           - Normal stomach.                           - Submucosal nodule found in the duodenum. Biopsied. A. DUODENUM, SUBMUCOSAL LESION, BIOPSY:  - Benign small bowel mucosa with foveolar metaplasia, suggestive of  peptic injury (see comment)   B. ESOPHAGUS, AT 39 CM, BIOPSY:  - Barrett mucosa, no dysplasia    Last colonoscopy: 11/05/2018 Colocolonic anastomosis at 25 cm from the anus, diverticulosis   Repeat colonoscopy 2025 Repeat EGD 2029  Past Medical History: Past Medical History:  Diagnosis Date   Appendiceal carcinoid tumor    resection  15 yrs ago   Barrett esophagus 03/29/2013   Preformed by Dr.Rourk   Chest tightness, discomfort, or pressure    Coronary artery disease    a. s/p prior stent to LCx b. patent stent by cath in 2009 c. low-risk NST in 06/2013   Hyperlipidemia    mixed   MI (myocardial infarction) (HCC)  Sleep apnea    Ulcer     Past Surgical History: Past Surgical History:  Procedure Laterality Date    appendiceal carcinoid   1996   APPENDECTOMY  1996   BIOPSY  08/15/2022   Procedure: BIOPSY;  Surgeon: Eartha Angelia Sieving, MD;  [facility];  Service: Gastroenterology;;   CARDIAC CATHETERIZATION     06/27/2007 and 09/17/2001   CHOLECYSTECTOMY  1998   COLONOSCOPY     COLONOSCOPY N/A 07/30/2013   Procedure: COLONOSCOPY;  Surgeon: Claudis RAYMOND Rivet, MD;  [facility];  Service: Endoscopy;  Laterality: N/A;  1030   COLONOSCOPY N/A 11/05/2018   Procedure: COLONOSCOPY;  Surgeon: Rivet Claudis RAYMOND, MD;  [facility];  Service: Endoscopy;  Laterality: N/A;  1200   ESOPHAGOGASTRODUODENOSCOPY N/A 03/29/2013   Procedure: ESOPHAGOGASTRODUODENOSCOPY (EGD);  Surgeon: Lamar CHRISTELLA Hollingshead, MD;  [facility];  Service: Endoscopy;  Laterality: N/A;   ESOPHAGOGASTRODUODENOSCOPY (EGD) WITH PROPOFOL  N/A 08/15/2022   Procedure: ESOPHAGOGASTRODUODENOSCOPY (EGD) WITH PROPOFOL ;  Surgeon: Eartha Angelia Sieving, MD;  [facility];  Service: Gastroenterology;  Laterality: N/A;  8:00am;asa 1   HERNIA REPAIR     NASAL SEPTOPLASTY W/ TURBINOPLASTY Bilateral 01/12/2014   Procedure: BILATERAL TURBINATE RESECTION/SEPTOPLASTY ;  Surgeon: Ana LELON Moccasin, MD;  Location: [facility];  Service: ENT;  Laterality: Bilateral;   SPLENECTOMY  1996   along with colon resection   TONSILLECTOMY     UPPER GASTROINTESTINAL ENDOSCOPY     03/29/13    Family History: Family History  Problem Relation Age of Onset   Heart attack Father    Cancer Father        prostate cancer   Heart disease Father    Colon cancer Father     Social History: Social History   Tobacco Use  Smoking Status Never  Smokeless Tobacco Never   Social History   Substance and Sexual Activity  Alcohol Use No   Alcohol/week: 0.0 standard drinks of alcohol   Social History   Substance and Sexual Activity  Drug Use No    Allergies: Allergies  Allergen Reactions   Asa [Aspirin]     PT HAS PEPTIC ULCER   Morphine And Codeine     hallucinations    Medications: Current Outpatient Medications  Medication Sig Dispense Refill   acetaminophen  (TYLENOL ) 500 MG tablet Take 500 mg by mouth every 6 (six) hours as needed for moderate pain or headache.     cholestyramine (QUESTRAN) 4 GM/DOSE powder Take 4 g by mouth daily.      clopidogrel  (PLAVIX ) 75 MG tablet Take 1 tablet by mouth once daily 90 tablet 2   diphenhydramine -acetaminophen  (TYLENOL  PM) 25-500 MG TABS tablet Take 1 tablet by mouth at bedtime as needed (sleep).     loratadine (CLARITIN) 10 MG tablet Take 10 mg by mouth daily.     losartan (COZAAR) 50 MG tablet Take 50 mg by mouth daily.     nitroGLYCERIN  (NITROSTAT ) 0.4 MG  SL tablet Place 1 tablet (0.4 mg total) under the tongue every 5 (five) minutes as needed for chest pain. 25 tablet 4   pantoprazole  (PROTONIX ) 40 MG tablet Take 1 tablet (40 mg total) by mouth daily before breakfast. 90 tablet 3   Probiotic Product (PROBIOTIC DAILY PO) Take 1 capsule by mouth daily.      rosuvastatin  (CRESTOR ) 20 MG tablet Take 1 tablet (20 mg total) by mouth daily. 90 tablet 3   No  current facility-administered medications for this visit.    Review of Systems: GENERAL: negative for malaise, night sweats HEENT: No changes in hearing or vision, no nose bleeds or other nasal problems. NECK: Negative for lumps, goiter, pain and significant neck swelling RESPIRATORY: Negative for cough, wheezing CARDIOVASCULAR: Negative for chest pain, leg swelling, palpitations, orthopnea GI: SEE HPI MUSCULOSKELETAL: Negative for joint pain or swelling, back pain, and muscle pain. SKIN: Negative for lesions, rash PSYCH: Negative for sleep disturbance, mood disorder and recent psychosocial stressors. HEMATOLOGY Negative for prolonged bleeding, bruising easily, and swollen nodes. ENDOCRINE: Negative for cold or heat intolerance, polyuria, polydipsia and goiter. NEURO: negative for tremor, gait imbalance, syncope and seizures. The remainder of the review of systems is noncontributory.   Physical Exam: BP (!) 140/83 (BP Location: Left Arm, Patient Position: Sitting, Cuff Size: Large)   Pulse 66   Temp (!) 97.1 F (36.2 C) (Temporal)   Ht 6' 1 (1.854 m)   Wt 216 lb 9.6 oz (98.2 kg)   BMI 28.58 kg/m  GENERAL: The patient is AO x3, in no acute distress. HEENT: Head is normocephalic and atraumatic. EOMI are intact. Mouth is well hydrated and without lesions. NECK: Supple. No masses LUNGS: Clear to auscultation. No presence of rhonchi/wheezing/rales. Adequate chest expansion HEART: RRR, normal s1 and s2. ABDOMEN: Soft, nontender, no guarding, no peritoneal signs, and nondistended. BS +.  No masses. EXTREMITIES: Without any cyanosis, clubbing, rash, lesions or edema. NEUROLOGIC: AOx3, no focal motor deficit. SKIN: no jaundice, no rashes  Imaging/Labs: as above  I personally reviewed and interpreted the available labs, imaging and endoscopic files.  Impression and Plan: Samuel Tanner is a 73 y.o. 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  Patient has presented chronic history of diarrhea which has been managed with bile salt binders after you have diarrhea with cholecystectomy.  This was controlled for multiple years but more recently he has presented increased amount of bowel movements.  Due to these, we discussed the need to rule out infectious etiologies with stool based testing, but will also check for thyroid abnormalities and celiac disease. For now, will continue same dosage of cholestyramine.  He should also proceed with surveillance colonoscopy after undergoing cardiology evaluation soon. If still having diarrhea and negative workup, will need to obtain random biopsies at that time.  His GERD appears to be controlled, we will continue pantoprazole  at the same dosage.  -Perform blood and stool workup -Hold off on taking Imodium until we receive the results of stool testing. -Proceed with cardiology visit next week to obtain clearance for colonoscopy and holding Plavix  -Continue cholestyramine 4 g daily -Continue pantoprazole  40 mg daily  All questions were answered.      Samuel Fortune, MD Gastroenterology and Hepatology [facility] Gastroenterology

## 2023-10-03 NOTE — Patient Instructions (Addendum)
 Perform blood and stool workup Hold off on taking Imodium until we receive the results of stool testing. Proceed with cardiology visit next week to obtain clearance for colonoscopy and holding Plavix  Continue cholestyramine 4 g daily Continue pantoprazole  40 mg daily

## 2023-10-04 LAB — TSH: TSH: 1.29 m[IU]/L (ref 0.40–4.50)

## 2023-10-04 LAB — CELIAC DISEASE PANEL
(tTG) Ab, IgA: <1 U/mL
(tTG) Ab, IgG: <1 U/mL
Gliadin IgA: <1 U/mL
Gliadin IgG: <1 U/mL
Immunoglobulin A: 176 mg/dL (ref 70–320)

## 2023-10-07 ENCOUNTER — Ambulatory Visit (INDEPENDENT_AMBULATORY_CARE_PROVIDER_SITE_OTHER): Payer: Self-pay | Admitting: Gastroenterology

## 2023-10-07 LAB — C. DIFFICILE GDH AND TOXIN A/B
GDH ANTIGEN: NOT DETECTED
MICRO NUMBER:: 16988693
SPECIMEN QUALITY:: ADEQUATE
TOXIN A AND B: NOT DETECTED

## 2023-10-07 LAB — GASTROINTESTINAL PATHOGEN PNL
CampyloBacter Group: NOT DETECTED
Norovirus GI/GII: NOT DETECTED
Rotavirus A: NOT DETECTED
Salmonella species: NOT DETECTED
Shiga Toxin 1: NOT DETECTED
Shiga Toxin 2: NOT DETECTED
Shigella Species: NOT DETECTED
Vibrio Group: NOT DETECTED
Yersinia enterocolitica: NOT DETECTED

## 2023-10-07 LAB — OVA AND PARASITE EXAMINATION
CONCENTRATE RESULT:: NONE SEEN
MICRO NUMBER:: 16986976
SPECIMEN QUALITY:: ADEQUATE
TRICHROME RESULT:: NONE SEEN

## 2023-10-08 ENCOUNTER — Ambulatory Visit: Attending: Cardiology | Admitting: Cardiology

## 2023-10-08 ENCOUNTER — Encounter: Payer: Self-pay | Admitting: Cardiology

## 2023-10-08 VITALS — BP 134/78 | HR 68 | Ht 73.0 in | Wt 214.4 lb

## 2023-10-08 DIAGNOSIS — E782 Mixed hyperlipidemia: Secondary | ICD-10-CM | POA: Diagnosis not present

## 2023-10-08 DIAGNOSIS — I2510 Atherosclerotic heart disease of native coronary artery without angina pectoris: Secondary | ICD-10-CM

## 2023-10-08 DIAGNOSIS — Z01810 Encounter for preprocedural cardiovascular examination: Secondary | ICD-10-CM

## 2023-10-08 MED ORDER — ROSUVASTATIN CALCIUM 40 MG PO TABS: 40.0000 mg | ORAL_TABLET | Freq: Every day | ORAL | 3 refills | Status: AC

## 2023-10-08 NOTE — Progress Notes (Signed)
 Clinical Summary Mr. Rozo is a 73 y.o.male seen today for follow up of the following medical problems.      1. CAD   - prior stent to LCX   - last cath 2009 with patent stent, overall non-obstructive disease. LVEF at that time showed normal LV function.   - from prior notes developed peptic ulcer of ASA 81mg  daily, previous admit with GI bleeding thought secondary to NSAID use (aleve).   -Has been on plavix  for secondary prevention. Soft bp's prohibit ACE-I or beta blocker -  exercise stress nucler 06/2013, no ischemia with Duke treadmill score of 10.   - no chest pains, no SOB/DOE - compliant with meds     2. Hyperlipidemia   - 06/2023 TC 148 TG 166 HDL 35 LDL 84   3. OSA screen - + snoring. + daytime somnolence - has not been intersted in sleep study   4. Elevated bp -compliant with meds.   5. Preoperative evaluation - plans for outpatient colonscopy, would need to hold plavix   6. PVCs - chronic, asymptomatic.    Past Medical History:  Diagnosis Date   Appendiceal carcinoid tumor    resection  15 yrs ago   Barrett esophagus 03/29/2013   Preformed by Dr.Rourk   Chest tightness, discomfort, or pressure    Coronary artery disease    a. s/p prior stent to LCx b. patent stent by cath in 2009 c. low-risk NST in 06/2013   Hyperlipidemia    mixed   MI (myocardial infarction) (HCC)    Sleep apnea    Ulcer      Allergies  Allergen Reactions   Asa [Aspirin]     PT HAS PEPTIC ULCER   Morphine And Codeine     hallucinations     Current Outpatient Medications  Medication Sig Dispense Refill   acetaminophen  (TYLENOL ) 500 MG tablet Take 500 mg by mouth every 6 (six) hours as needed for moderate pain or headache.     cholestyramine (QUESTRAN) 4 GM/DOSE powder Take 4 g by mouth daily.      clopidogrel  (PLAVIX ) 75 MG tablet Take 1 tablet by mouth once daily 90 tablet 2   diphenhydramine -acetaminophen  (TYLENOL  PM) 25-500 MG TABS tablet Take 1 tablet by mouth at  bedtime as needed (sleep).     loratadine (CLARITIN) 10 MG tablet Take 10 mg by mouth daily.     losartan (COZAAR) 50 MG tablet Take 50 mg by mouth daily.     nitroGLYCERIN  (NITROSTAT ) 0.4 MG SL tablet Place 1 tablet (0.4 mg total) under the tongue every 5 (five) minutes as needed for chest pain. 25 tablet 4   pantoprazole  (PROTONIX ) 40 MG tablet Take 1 tablet (40 mg total) by mouth daily before breakfast. 90 tablet 3   Probiotic Product (PROBIOTIC DAILY PO) Take 1 capsule by mouth daily.      rosuvastatin  (CRESTOR ) 40 MG tablet Take 1 tablet (40 mg total) by mouth daily. 90 tablet 3   No current facility-administered medications for this visit.     Past Surgical History:  Procedure Laterality Date    appendiceal carcinoid   1996   APPENDECTOMY  1996   BIOPSY  08/15/2022   Procedure: BIOPSY;  Surgeon: Eartha Angelia Sieving, MD;  [facility];  Service: Gastroenterology;;   CARDIAC CATHETERIZATION     06/27/2007 and 09/17/2001   CHOLECYSTECTOMY  1998   COLONOSCOPY     COLONOSCOPY N/A 07/30/2013   Procedure: COLONOSCOPY;  Surgeon: Claudis RAYMOND Rivet, MD;  [facility];  Service: Endoscopy;  Laterality: N/A;  1030   COLONOSCOPY N/A 11/05/2018   Procedure: COLONOSCOPY;  Surgeon: Rivet Claudis RAYMOND, MD;  [facility];  Service: Endoscopy;  Laterality: N/A;  1200   ESOPHAGOGASTRODUODENOSCOPY N/A 03/29/2013   Procedure: ESOPHAGOGASTRODUODENOSCOPY (EGD);  Surgeon: Lamar CHRISTELLA Hollingshead, MD;  [facility];  Service: Endoscopy;  Laterality: N/A;   ESOPHAGOGASTRODUODENOSCOPY (EGD) WITH PROPOFOL  N/A 08/15/2022   Procedure: ESOPHAGOGASTRODUODENOSCOPY (EGD) WITH PROPOFOL ;  Surgeon: Eartha Angelia Sieving, MD;  [facility];  Service: Gastroenterology;  Laterality: N/A;  8:00am;asa 1   HERNIA REPAIR     NASAL SEPTOPLASTY W/ TURBINOPLASTY Bilateral 01/12/2014   Procedure: BILATERAL TURBINATE RESECTION/SEPTOPLASTY ;  Surgeon: Ana LELON Moccasin, MD;  Location: [facility];  Service: ENT;  Laterality: Bilateral;   SPLENECTOMY  1996   along with colon resection   TONSILLECTOMY     UPPER GASTROINTESTINAL ENDOSCOPY     03/29/13     Allergies  Allergen Reactions   Asa [Aspirin]     PT HAS PEPTIC ULCER   Morphine And Codeine     hallucinations      Family History  Problem Relation Age of Onset   Heart attack Father    Cancer Father        prostate cancer   Heart disease Father    Colon cancer Father      Social History Mr. Senat reports that he has never smoked. He has never used smokeless tobacco. Mr. Dhondt reports no history of alcohol use.     Physical Examination Vitals:   10/08/23 0946 10/08/23 1006  BP: 138/80 134/78  Pulse: 68   SpO2: 92%    Filed Weights   10/08/23 0946  Weight: 214 lb 6.4 oz (97.3 kg)    Gen: resting comfortably, no acute distress HEENT: no scleral icterus, pupils equal round and reactive, no palptable cervical adenopathy,  CV: RRR, no mrg, no jvd Resp: Clear to auscultation bilaterally GI: abdomen is soft, non-tender, non-distended, normal bowel sounds, no hepatosplenomegaly MSK: extremities are warm, no edema.  Skin: warm, no rash Neuro:  no focal deficits Psych: appropriate affect   Diagnostic Studies  06/2007 Cath   HEMODYNAMIC DATA:   1. Central aortic pressure was 118/78.   2. Left ventricular pressure 104/9.   3. There was no gradient on pullback across the aortic valve.   ANGIOGRAPHIC DATA:   1. Ventriculography done in the RAO projection reveals vigorous global   systolic function. No segmental abnormalities are identified.   There was a small inferior diverticulum, and this had been noted on   the previous angiographic study.   2. The left main is free of critical disease.   3. The LAD has a clear-cut fairly focal mid stenosis of about 40%. It   does not appear to be high-grade or flow-limiting. The vessel then   goes distally to the apex where there was mild  luminal irregularity   after the diagonal. The diagonal itself is a small bifurcating   vessel without critical narrowing.   4. The circumflex provides a large marginal Caydn Justen that has been   stented. There is a perhaps about 20% narrowing in the midportion   of the stent which corresponds to the area seen on intravascular   ultrasound. Distally the vessel trifurcates. It does not appear   to be flow-limiting.   5. The right coronary artery has  mild luminal irregularity but no   significant focal stenosis.   6. Intravascular ultrasound was performed. There was good apposition   throughout. The overall minimum lumen diameter appeared to be   appropriate for the stent being between 3.25 and 3.4 throughout   most of the stent. In the midportion of the stent, there is a very   small filling defect that could represent tissue prolapse. Its   density is that of some plaque, although a small amount of layering   thrombus cannot be excluded.   CONCLUSION:   1. Preserved left ventricular function.   2. Mild disease of the left anterior descending artery.   3. Normal-appearing right coronary artery.   4. Continued patency of the previously placed stent with a small area   of filling defect possibly representing small area of plaque,   tissue prolapse, and/or thrombus.   DISPOSITION: The patient will be treated medically. We will increase   his Plavix  to b.i.d. for about 1 week, then resume his normal amount.   We will need to monitor his symptoms prospectively.    06/2013 MPI   IMPRESSION:   1. Negative exercise MPI for ischemia   2. Negative stress EKG for ischemia   3. Normal left ventricular systolic function, LVEF 70%   4. Duke treadmill score of 10, consistent with low risk for major   cardiac events   5. Exceptional exercise functional capacity (>150% of predicted   based on age and gender)     Assessment and Plan   1. CAD   - on plavix  for secondary prevention due to GI issues  on ASA - not on ACE-I or beta blocker due to prior soft bp's -no symptoms - EKG SR, no acute ischemic changes - continue current meds   2. Hyperlipidemia   - LDL above goal, increase crestor  to 40mg  daily  3.Preoperative evaluation - ok to proceed from cardiac standpoint with colonscopy. Can hold plavix  5 days prior and resume day after   F/u 1 year     Dorn PHEBE Ross, M.D.,

## 2023-10-08 NOTE — Patient Instructions (Signed)
 Medication Instructions:  Your physician has recommended you make the following change in your medication:   -Increase Crestor  to 40 mg once daily   *If you need a refill on your cardiac medications before your next appointment, please call your pharmacy*  Lab Work: None If you have labs (blood work) drawn today and your tests are completely normal, you will receive your results only by: MyChart Message (if you have MyChart) OR A paper copy in the mail If you have any lab test that is abnormal or we need to change your treatment, we will call you to review the results.  Testing/Procedures: None  Follow-Up: At [facility], you and your health needs are our priority.  As part of our continuing mission to provide you with exceptional heart care, our providers are all part of one team.  This team includes your primary Cardiologist (physician) and Advanced Practice Providers or APPs (Physician Assistants and Nurse Practitioners) who all work together to provide you with the care you need, when you need it.  Your next appointment:   1 year(s)  Provider:   You may see Alvan Carrier, MD or one of the following Advanced Practice Providers on your designated Care Team:   Laymon Qua, PA-C  Sc[place]ia [place], [place] Olivia Pavy, [place]     We recommend signing up for the patient portal called MyChart.  Sign up information is provided on this After Visit Summary.  MyChart is used to connect with patients for Virtual Visits (Telemedicine).  Patients are able to view lab/test results, encounter notes, upcoming appointments, etc.  Non-urgent messages can be sent to your provider as well.   To learn more about what you can do with MyChart, go to [email or web address].   Other Instructions

## 2023-10-23 ENCOUNTER — Telehealth (INDEPENDENT_AMBULATORY_CARE_PROVIDER_SITE_OTHER): Payer: Self-pay

## 2023-10-23 NOTE — Telephone Encounter (Signed)
 Taken from office note with Dorn Ross, MD. Assessment and Plan    1. CAD   - on plavix  for secondary prevention due to GI issues on ASA - not on ACE-I or beta blocker due to prior soft bp's -no symptoms - EKG SR, no acute ischemic changes - continue current meds   2. Hyperlipidemia   - LDL above goal, increase crestor  to 40mg  daily   3.Preoperative evaluation - ok to proceed from cardiac standpoint with colonscopy. Can hold plavix  5 days prior and resume day after

## 2023-10-23 NOTE — Telephone Encounter (Signed)
 ATC pt to schedule TCS, no answer. LVM for call back.

## 2023-10-24 MED ORDER — PEG 3350-KCL-NA BICARB-NACL 420 G PO SOLR: 4000.0000 mL | Freq: Once | ORAL | 0 refills | Status: AC

## 2023-10-24 NOTE — Addendum Note (Signed)
 Addended by: JEANELL GRAEME RAMAN on: 10/24/2023 09:29 AM   Modules accepted: Orders

## 2023-10-24 NOTE — Telephone Encounter (Signed)
 Spoke with pt. Scheduled for TCS with Dr. Eartha on 10/15. Aware will leave instructions for pick up at front desk. He is aware to start holding plavix  starting tomorrow. Rx for prep sent to pharmacy

## 2023-10-25 ENCOUNTER — Other Ambulatory Visit (INDEPENDENT_AMBULATORY_CARE_PROVIDER_SITE_OTHER): Payer: Self-pay | Admitting: Gastroenterology

## 2023-10-25 MED ORDER — PROMETHAZINE HCL 12.5 MG PO TABS: 12.5000 mg | ORAL_TABLET | Freq: Four times a day (QID) | ORAL | 0 refills | Status: AC | PRN

## 2023-10-30 ENCOUNTER — Encounter (HOSPITAL_COMMUNITY)
Admission: RE | Disposition: A | Discharge status: Home / Self Care | Payer: Self-pay | Source: Home / Self Care | Attending: Gastroenterology

## 2023-10-30 ENCOUNTER — Ambulatory Visit (HOSPITAL_COMMUNITY): Admitting: Certified Registered"

## 2023-10-30 ENCOUNTER — Ambulatory Visit (HOSPITAL_COMMUNITY)
Admission: RE | Admit: 2023-10-30 | Discharge: 2023-10-30 | Disposition: A | Discharge status: Home / Self Care | Attending: Gastroenterology | Admitting: Gastroenterology

## 2023-10-30 ENCOUNTER — Encounter (HOSPITAL_COMMUNITY): Payer: Self-pay | Admitting: Gastroenterology

## 2023-10-30 ENCOUNTER — Other Ambulatory Visit: Payer: Self-pay

## 2023-10-30 DIAGNOSIS — I10 Essential (primary) hypertension: Secondary | ICD-10-CM | POA: Diagnosis not present

## 2023-10-30 DIAGNOSIS — K648 Other hemorrhoids: Secondary | ICD-10-CM | POA: Diagnosis not present

## 2023-10-30 DIAGNOSIS — I2510 Atherosclerotic heart disease of native coronary artery without angina pectoris: Secondary | ICD-10-CM | POA: Insufficient documentation

## 2023-10-30 DIAGNOSIS — D125 Benign neoplasm of sigmoid colon: Secondary | ICD-10-CM | POA: Insufficient documentation

## 2023-10-30 DIAGNOSIS — Z9221 Personal history of antineoplastic chemotherapy: Secondary | ICD-10-CM | POA: Insufficient documentation

## 2023-10-30 DIAGNOSIS — Z955 Presence of coronary angioplasty implant and graft: Secondary | ICD-10-CM | POA: Insufficient documentation

## 2023-10-30 DIAGNOSIS — K635 Polyp of colon: Secondary | ICD-10-CM | POA: Diagnosis not present

## 2023-10-30 DIAGNOSIS — D175 Benign lipomatous neoplasm of intra-abdominal organs: Secondary | ICD-10-CM | POA: Insufficient documentation

## 2023-10-30 DIAGNOSIS — Z1211 Encounter for screening for malignant neoplasm of colon: Secondary | ICD-10-CM | POA: Diagnosis not present

## 2023-10-30 DIAGNOSIS — E785 Hyperlipidemia, unspecified: Secondary | ICD-10-CM | POA: Insufficient documentation

## 2023-10-30 DIAGNOSIS — G4733 Obstructive sleep apnea (adult) (pediatric): Secondary | ICD-10-CM | POA: Diagnosis not present

## 2023-10-30 DIAGNOSIS — I252 Old myocardial infarction: Secondary | ICD-10-CM | POA: Insufficient documentation

## 2023-10-30 DIAGNOSIS — Z800 Family history of malignant neoplasm of digestive organs: Secondary | ICD-10-CM | POA: Insufficient documentation

## 2023-10-30 DIAGNOSIS — K5730 Diverticulosis of large intestine without perforation or abscess without bleeding: Secondary | ICD-10-CM | POA: Insufficient documentation

## 2023-10-30 DIAGNOSIS — K219 Gastro-esophageal reflux disease without esophagitis: Secondary | ICD-10-CM | POA: Diagnosis not present

## 2023-10-30 DIAGNOSIS — Z79899 Other long term (current) drug therapy: Secondary | ICD-10-CM | POA: Insufficient documentation

## 2023-10-30 DIAGNOSIS — Z860100 Personal history of colon polyps, unspecified: Secondary | ICD-10-CM

## 2023-10-30 DIAGNOSIS — Z980 Intestinal bypass and anastomosis status: Secondary | ICD-10-CM | POA: Diagnosis not present

## 2023-10-30 HISTORY — PX: COLONOSCOPY: SHX5424

## 2023-10-30 LAB — HM COLONOSCOPY

## 2023-10-30 SURGERY — COLONOSCOPY
Anesthesia: General

## 2023-10-30 MED ORDER — PROPOFOL 10 MG/ML IV BOLUS
INTRAVENOUS | Status: DC | PRN
  Administered 2023-10-30: 100 mg via INTRAVENOUS
  Administered 2023-10-30: 125 ug/kg/min via INTRAVENOUS

## 2023-10-30 MED ORDER — LACTATED RINGERS IV SOLN: INTRAVENOUS | Status: DC | PRN

## 2023-10-30 MED ORDER — LACTATED RINGERS IV SOLN: INTRAVENOUS | Status: DC

## 2023-10-30 MED ORDER — LIDOCAINE HCL (CARDIAC) PF 100 MG/5ML IV SOSY
PREFILLED_SYRINGE | INTRAVENOUS | Status: DC | PRN
  Administered 2023-10-30: 100 mg via INTRAVENOUS

## 2023-10-30 NOTE — Anesthesia Preprocedure Evaluation (Signed)
 Anesthesia Evaluation  Patient identified by MRN, date of birth, ID band Patient awake    Reviewed: Allergy & Precautions, H&P , NPO status , Patient's Chart, lab work & pertinent test results, reviewed documented beta blocker date and time   Airway Mallampati: II  TM Distance: >3 FB Neck ROM: full    Dental no notable dental hx.    Pulmonary sleep apnea    Pulmonary exam normal breath sounds clear to auscultation       Cardiovascular Exercise Tolerance: Good hypertension, + CAD and + Past MI   Rhythm:regular Rate:Normal     Neuro/Psych negative neurological ROS  negative psych ROS   GI/Hepatic Neg liver ROS,GERD  ,,  Endo/Other  negative endocrine ROS    Renal/GU negative Renal ROS  negative genitourinary   Musculoskeletal   Abdominal   Peds  Hematology  (+) Blood dyscrasia, anemia   Anesthesia Other Findings   Reproductive/Obstetrics negative OB ROS                              Anesthesia Physical Anesthesia Plan  ASA: 3  Anesthesia Plan: General   Post-op Pain Management:    Induction:   PONV Risk Score and Plan: Propofol  infusion  Airway Management Planned:   Additional Equipment:   Intra-op Plan:   Post-operative Plan:   Informed Consent: I have reviewed the patients History and Physical, chart, labs and discussed the procedure including the risks, benefits and alternatives for the proposed anesthesia with the patient or authorized representative who has indicated his/her understanding and acceptance.     Dental Advisory Given  Plan Discussed with: CRNA  Anesthesia Plan Comments:         Anesthesia Quick Evaluation

## 2023-10-30 NOTE — Op Note (Addendum)
 [facility] Patient Name: Samuel Tanner Procedure Date: 10/30/2023 9:27 AM MRN: [id number] Date of Birth: March 31, 1950 Attending MD: [name] , , [id number] CSN: [id number] Age: 73 Admit Type: Outpatient Procedure:                Colonoscopy Indications:              Screening in patient at increased risk: Family                            history of 1st-degree relative with colorectal                            cancer, High risk colon cancer surveillance:                            Personal history of colonic polyps Providers:                [name], Jon LABOR. Gerome RN, RN, [place]                            Wilson, Technician Referring MD:              Medicines:                Monitored Anesthesia Care Complications:            No immediate complications. Estimated Blood Loss:     Estimated blood loss: none. Procedure:                Pre-Anesthesia Assessment:                           - Prior to the procedure, a History and Physical                            was performed, and patient medications, allergies                            and sensitivities were reviewed. The patient's                            tolerance of previous anesthesia was reviewed.                           - The risks and benefits of the procedure and the                            sedation options and risks were discussed with the                            patient. All questions were answered and informed                            consent was obtained.                           - ASA Grade Assessment: II -  A patient with mild                            systemic disease.                           After obtaining informed consent, the colonoscope                            was passed under direct vision. Throughout the                            procedure, the patient's blood pressure, pulse, and                            oxygen saturations were monitored continuously. The                             [id number]-[id number] ([id number]) Peds Colon was introduced                            through the anus and advanced to the the cecum,                            identified by appendiceal orifice and ileocecal                            valve. The colonoscopy was performed without                            difficulty. The patient tolerated the procedure                            well. The quality of the bowel preparation was                            excellent. Scope In: 10:40:50 AM Scope Out: 10:52:28 AM Scope Withdrawal Time: 0 hours 9 minutes 20 seconds  Total Procedure Duration: 0 hours 11 minutes 38 seconds  Findings:      The perianal and digital rectal examinations were normal.      A few small-mouthed diverticula were found in the descending colon and       ascending colon.      There was a medium-sized lipoma, in the transverse colon.      A 4 mm polyp was found in the sigmoid colon. The polyp was sessile. The       polyp was removed with a cold snare. Resection and retrieval were       complete.      There was evidence of a prior end-to-end colo-colonic anastomosis at 25       cm proximal to the anus. This was patent and was characterized by       healthy appearing mucosa. The anastomosis was traversed.      Non-bleeding internal hemorrhoids were found during retroflexion. The       hemorrhoids were small. Impression:               -  Diverticulosis in the descending colon and in the                            ascending colon.                           - Medium-sized lipoma in the transverse colon.                           - One 4 mm polyp in the sigmoid colon, removed with                            a cold snare. Resected and retrieved.                           - Patent end-to-end colo-colonic anastomosis,                            characterized by healthy appearing mucosa.                           - Non-bleeding internal hemorrhoids. Moderate Sedation:      Per  Anesthesia Care Recommendation:           - Discharge patient to home (ambulatory).                           - Resume previous diet.                           - Await pathology results.                           - Repeat colonoscopy for surveillance based on                            pathology results. Procedure Code(s):        --- Professional ---                           [phone], Colonoscopy, flexible; with removal of                            tumor(s), polyp(s), or other lesion(s) by snare                            technique Diagnosis Code(s):        --- Professional ---                           Z80.0, Family history of malignant neoplasm of                            digestive organs                           Z86.010, Personal history of colonic polyps  K64.8, Other hemorrhoids                           D17.5, Benign lipomatous neoplasm of                            intra-abdominal organs                           D12.5, Benign neoplasm of sigmoid colon                           Z98.0, Intestinal bypass and anastomosis status                           K57.30, Diverticulosis of large intestine without                            perforation or abscess without bleeding CPT copyright 2022 American Medical Association. All rights reserved. The codes documented in this report are preliminary and upon coder review may  be revised to meet current compliance requirements. [name], MD [name],  10/30/2023 11:00:32 AM This report has been signed electronically. Number of Addenda: 0

## 2023-10-30 NOTE — Interval H&P Note (Signed)
 History and Physical Interval Note:  10/30/2023 9:29 AM  Samuel Tanner  has presented today for surgery, with the diagnosis of HX COLON POLYPS, FAMILY HX COLON CANCER.  The various methods of treatment have been discussed with the patient and family. After consideration of risks, benefits and other options for treatment, the patient has consented to  Procedure(s) with comments: COLONOSCOPY (N/A) - 1045am as a surgical intervention.  The patient's history has been reviewed, patient examined, no change in status, stable for surgery.  I have reviewed the patient's chart and labs.  Questions were answered to the patient's satisfaction.     [name]

## 2023-10-30 NOTE — Discharge Instructions (Addendum)
You are being discharged to home.  Resume your previous diet.  We are waiting for your pathology results.  Your physician has recommended a repeat colonoscopy for surveillance based on pathology results.  Restart Plavix tonight.  

## 2023-10-30 NOTE — Transfer of Care (Signed)
 Immediate Anesthesia Transfer of Care Note  Patient: GEARLD KERSTEIN  Procedure(s) Performed: COLONOSCOPY  Patient Location: Endoscopy Unit  Anesthesia Type:General  Level of Consciousness: awake, alert , oriented, and patient cooperative  Airway & Oxygen Therapy: Patient Spontanous Breathing  Post-op Assessment: Report given to RN and Post -op Vital signs reviewed and stable  Post vital signs: Reviewed and stable  Last Vitals:  Vitals Value Taken Time  BP 94/63 10/30/23 10:56  Temp 36.7 C 10/30/23 10:56  Pulse 66 10/30/23 10:56  Resp 12 10/30/23 10:56  SpO2 94 % 10/30/23 10:56    Last Pain:  Vitals:   10/30/23 1056  TempSrc: Oral  PainSc:       Patients Stated Pain Goal: 6 (10/30/23 0933)  Complications: No notable events documented.

## 2023-10-31 ENCOUNTER — Ambulatory Visit (INDEPENDENT_AMBULATORY_CARE_PROVIDER_SITE_OTHER): Payer: Self-pay | Admitting: Gastroenterology

## 2023-10-31 LAB — SURGICAL PATHOLOGY

## 2023-11-01 ENCOUNTER — Encounter (INDEPENDENT_AMBULATORY_CARE_PROVIDER_SITE_OTHER): Payer: Self-pay | Admitting: *Deleted

## 2023-11-01 ENCOUNTER — Encounter (HOSPITAL_COMMUNITY): Payer: Self-pay | Admitting: Gastroenterology

## 2023-11-01 NOTE — Progress Notes (Signed)
 5 yr TCS noted in recall Patient result letter mailed procedure note and pathology result faxed to PCP

## 2023-11-04 NOTE — Anesthesia Postprocedure Evaluation (Signed)
 Anesthesia Post Note  Patient: Samuel Tanner  Procedure(s) Performed: COLONOSCOPY  Patient location during evaluation: Phase II Anesthesia Type: General Level of consciousness: awake Pain management: pain level controlled Vital Signs Assessment: post-procedure vital signs reviewed and stable Respiratory status: spontaneous breathing and respiratory function stable Cardiovascular status: blood pressure returned to baseline and stable Postop Assessment: no headache and no apparent nausea or vomiting Anesthetic complications: no Comments: Late entry   No notable events documented.   Last Vitals:  Vitals:   10/30/23 0933 10/30/23 1056  BP: 124/84 94/63  Pulse: 80 66  Resp: 12 12  Temp: 36.5 C 36.7 C  SpO2: 96% 94%    Last Pain:  Vitals:   10/30/23 1056  TempSrc: Oral  PainSc:                  Samuel Tanner Samuel Tanner

## 2023-11-14 ENCOUNTER — Encounter (INDEPENDENT_AMBULATORY_CARE_PROVIDER_SITE_OTHER): Payer: Self-pay | Admitting: Gastroenterology

## 2023-11-21 ENCOUNTER — Encounter (INDEPENDENT_AMBULATORY_CARE_PROVIDER_SITE_OTHER): Payer: Self-pay | Admitting: Gastroenterology

## 2023-11-28 ENCOUNTER — Encounter: Payer: Self-pay | Admitting: Internal Medicine

## 2023-12-10 ENCOUNTER — Encounter: Payer: Self-pay | Admitting: *Deleted

## 2023-12-10 NOTE — Progress Notes (Signed)
 EDER MACEK                                          MRN: [id number]   12/10/2023   The [facility] Quality Team Specialist reviewed this patient medical record for the purposes of chart review for care gap closure. The following were reviewed: chart review for care gap closure-diabetic eye exam.    [facility] Quality Team

## 2023-12-19 ENCOUNTER — Encounter (INDEPENDENT_AMBULATORY_CARE_PROVIDER_SITE_OTHER): Payer: Self-pay | Admitting: Gastroenterology

## 2023-12-19 ENCOUNTER — Ambulatory Visit (INDEPENDENT_AMBULATORY_CARE_PROVIDER_SITE_OTHER): Admitting: Gastroenterology

## 2023-12-19 VITALS — BP 129/86 | HR 94 | Temp 97.3°F | Ht 73.0 in | Wt 213.4 lb

## 2023-12-19 DIAGNOSIS — K9089 Other intestinal malabsorption: Secondary | ICD-10-CM | POA: Diagnosis not present

## 2023-12-19 DIAGNOSIS — K219 Gastro-esophageal reflux disease without esophagitis: Secondary | ICD-10-CM

## 2023-12-19 DIAGNOSIS — K2270 Barrett's esophagus without dysplasia: Secondary | ICD-10-CM | POA: Diagnosis not present

## 2023-12-19 NOTE — Progress Notes (Signed)
 Samuel Tanner, M.D. Gastroenterology & Hepatology [facility] Gastroenterology [address] [place], [place] [address]  Primary Care Physician: Samuel Norleen PEDLAR, MD [address] [place] [address]  I will communicate my assessment and recommendations to the referring MD via EMR.  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  The patient was last seen on 10/03/2023. At that time, the patient was scheduled for colonoscopy  As he was endorsing some diarrhea issues, C. difficile, GI pathogen panel, TSH, ova and parasite and celiac disease panel were ordered which all were negative.  Notably, at time of colonoscopy, his diarrhea had resolved so no biopsies were performed.  Patient reports feeling well and denies any complaints.  He states that he does not have heartburn or dysphagia. No odynophagia. He takes pantoprazole  40 mg qday.  He has had diarrhea after taking gallbladder out. He takes cholestyramine 4 g once a day and it controls chronic diarrhea.  The patient denies having any nausea, vomiting, fever, chills, hematochezia, melena, hematemesis, abdominal distention, abdominal pain, diarrhea, jaundice, pruritus or weight loss.  Last EGD: 07/2022: - Esophageal mucosal changes classified as                            Barrett's stage C0-M1 per Prague criteria. Biopsied.                           - 3 cm hiatal hernia.                           - Normal stomach.                            - Submucosal nodule found in the duodenum. Biopsied. A. DUODENUM, SUBMUCOSAL LESION, BIOPSY:  - Benign small bowel mucosa with foveolar metaplasia, suggestive of  peptic injury (see comment)   B. ESOPHAGUS, AT 39 CM, BIOPSY:  - Barrett mucosa, no dysplasia    Last colonoscopy: 10/30/2023 - Diverticulosis in the descending colon and in the ascending colon. - Medium- sized lipoma in the transverse colon. - One 4 mm polyp in the sigmoid colon, removed with a cold snare. Resected and retrieved. - Patent end- to- end colo- colonic anastomosis, characterized by healthy appearing mucosa. - Non- bleeding internal hemorrhoids.   Repeat colonoscopy 2030 Repeat EGD 2029  Past Medical History: Past Medical History:  Diagnosis Date   Appendiceal carcinoid tumor (HCC)    resection  15 yrs ago   Barrett esophagus 03/29/2013   Preformed by Dr.Rourk   Chest tightness, discomfort, or pressure    Coronary artery disease    a. s/p prior stent to LCx b. patent stent by cath in 2009 c. low-risk NST in 06/2013   Hyperlipidemia    mixed   MI (myocardial infarction) [facility])    Sleep apnea    Ulcer     Past Surgical History:  Past Surgical History:  Procedure Laterality Date    appendiceal carcinoid   1996   APPENDECTOMY  1996   BIOPSY  08/15/2022   Procedure: BIOPSY;  Surgeon: [facility] Angelia Sieving, MD;  Location: [facility];  Service: Gastroenterology;;   CARDIAC CATHETERIZATION     06/27/2007 and 09/17/2001   CHOLECYSTECTOMY  1998   COLONOSCOPY     COLONOSCOPY N/A 07/30/2013   Procedure: COLONOSCOPY;  Surgeon: Samuel Tanner Rivet, MD;  Location: [facility];  Service: Endoscopy;  Laterality: N/A;  1030   COLONOSCOPY N/A 11/05/2018   Procedure: COLONOSCOPY;  Surgeon: Tanner Samuel RAYMOND, MD;  Location: [facility];  Service: Endoscopy;  Laterality: N/A;  1200   COLONOSCOPY N/A 10/30/2023   Procedure: COLONOSCOPY;  Surgeon: [facility] Angelia Sieving, MD;  [facility];   Service: Gastroenterology;  Laterality: N/A;  1045am   ESOPHAGOGASTRODUODENOSCOPY N/A 03/29/2013   Procedure: ESOPHAGOGASTRODUODENOSCOPY (EGD);  Surgeon: [facility] CHRISTELLA Hollingshead, MD;  [facility];  Service: Endoscopy;  Laterality: N/A;   ESOPHAGOGASTRODUODENOSCOPY (EGD) WITH PROPOFOL  N/A 08/15/2022   Procedure: ESOPHAGOGASTRODUODENOSCOPY (EGD) WITH PROPOFOL ;  Surgeon: Samuel Angelia Sieving, MD;  [facility]: [facility];  Service: Gastroenterology;  Laterality: N/A;  8:00am;asa 1   HERNIA REPAIR     NASAL SEPTOPLASTY W/ TURBINOPLASTY Bilateral 01/12/2014   Procedure: BILATERAL TURBINATE RESECTION/SEPTOPLASTY ;  Surgeon: [facility] LELON Moccasin, MD;  [facility];  Service: ENT;  Laterality: Bilateral;   SPLENECTOMY  1996   along with colon resection   TONSILLECTOMY     UPPER GASTROINTESTINAL ENDOSCOPY     03/29/13    Family History: Family History  Problem Relation Age of Onset   Heart attack Father    Cancer Father        prostate cancer   Heart disease Father    Colon cancer Father     Social History: Social History   Tobacco Use  Smoking Status Never  Smokeless Tobacco Never   Social History   Substance and Sexual Activity  Alcohol Use No   Alcohol/week: 0.0 standard drinks of alcohol   Social History   Substance and Sexual Activity  Drug Use No    Allergies: Allergies  Allergen Reactions   Asa [Aspirin]     PT HAS PEPTIC ULCER   Morphine And Codeine     hallucinations    Medications: Current Outpatient Medications  Medication Sig Dispense Refill   acetaminophen  (TYLENOL ) 500 MG tablet Take 500 mg by mouth every 6 (six) hours as needed for moderate pain or headache.     cholestyramine (QUESTRAN) 4 GM/DOSE powder Take 4 g by mouth daily.      clopidogrel  (PLAVIX ) 75 MG tablet Take 1 tablet by mouth once daily 90 tablet 2   diphenhydramine -acetaminophen  (TYLENOL  PM) 25-500 MG TABS tablet Take 1 tablet by mouth at bedtime as needed (sleep).      loratadine (CLARITIN) 10 MG tablet Take 10 mg by mouth daily.     losartan (COZAAR) 50 MG tablet Take 50 mg by mouth daily.     nitroGLYCERIN  (NITROSTAT ) 0.4 MG SL tablet Place 1 tablet (0.4 mg total) under the tongue every 5 (five) minutes as needed for chest pain. 25 tablet 4   pantoprazole  (PROTONIX ) 40 MG tablet Take 1 tablet (40 mg total) by mouth daily before breakfast. 90 tablet 3   promethazine  (PHENERGAN ) 12.5 MG tablet Take 1 tablet (12.5 mg total) by mouth every 6 (six) hours as needed for nausea  or vomiting (nausea). To take if presenting nausea with bowel prep 3 tablet 0   rosuvastatin  (CRESTOR ) 40 MG tablet Take 1 tablet (40 mg total) by mouth daily. 90 tablet 3   Probiotic Product (PROBIOTIC DAILY PO) Take 1 capsule by mouth daily.  (Patient not taking: Reported on 12/19/2023)     No current facility-administered medications for this visit.    Review of Systems: GENERAL: negative for malaise, night sweats HEENT: No changes in hearing or vision, no nose bleeds or other nasal problems. NECK: Negative for lumps, goiter, pain and significant neck swelling RESPIRATORY: Negative for cough, wheezing CARDIOVASCULAR: Negative for chest pain, leg swelling, palpitations, orthopnea GI: SEE HPI MUSCULOSKELETAL: Negative for joint pain or swelling, back pain, and muscle pain. SKIN: Negative for lesions, rash PSYCH: Negative for sleep disturbance, mood disorder and recent psychosocial stressors. HEMATOLOGY Negative for prolonged bleeding, bruising easily, and swollen nodes. ENDOCRINE: Negative for cold or heat intolerance, polyuria, polydipsia and goiter. NEURO: negative for tremor, gait imbalance, syncope and seizures. The remainder of the review of systems is noncontributory.   Physical Exam: BP 129/86 (BP Location: Left Arm, Patient Position: Sitting, Cuff Size: Normal)   Pulse 94   Temp (!) 97.3 F (36.3 C) (Temporal)   Ht 6' 1 (1.854 m)   Wt 213 lb 6.4 oz (96.8 kg)   BMI  28.15 kg/m  GENERAL: The patient is AO x3, in no acute distress. HEENT: Head is normocephalic and atraumatic. EOMI are intact. Mouth is well hydrated and without lesions. NECK: Supple. No masses LUNGS: Clear to auscultation. No presence of rhonchi/wheezing/rales. Adequate chest expansion HEART: RRR, normal s1 and s2. ABDOMEN: Soft, nontender, no guarding, no peritoneal signs, and nondistended. BS +. No masses. EXTREMITIES: Without any cyanosis, clubbing, rash, lesions or edema. NEUROLOGIC: AOx3, no focal motor deficit. SKIN: no jaundice, no rashes  Imaging/Labs: as above  I personally reviewed and interpreted the available labs, imaging and endoscopic files.  Impression and Plan: Samuel Tanner is a 73 y.o. 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  Patient has presented adequate control of his GERD with intake of PPI daily.  Will continue with his current doses of pantoprazole .  He also has presented chronic diarrhea that has been managed with cholestyramine due to bile salt induced diarrhea after undergoing cholecystectomy.  He has had adequate control of his diarrhea recently and we will continue the same dosage for now.  -Continue pantoprazole  40 mg daily -Continue cholestyramine 4 g daily  All questions were answered.      Samuel Fortune, MD Gastroenterology and Hepatology [facility] Gastroenterology

## 2023-12-19 NOTE — Patient Instructions (Signed)
 Continue pantoprazole  40 mg daily Continue cholestyramine 4 g daily

## 2023-12-26 ENCOUNTER — Other Ambulatory Visit: Payer: Self-pay | Admitting: Cardiology
